# Patient Record
Sex: Male | Born: 1940 | Race: White | Hispanic: No | Marital: Married | Smoking: Former smoker
Health system: Southern US, Community
[De-identification: ages and names within clinical notes are randomized; demographics above are authoritative.]

## PROBLEM LIST (undated history)

## (undated) DIAGNOSIS — I2510 Atherosclerotic heart disease of native coronary artery without angina pectoris: Secondary | ICD-10-CM

## (undated) DIAGNOSIS — Z87448 Personal history of other diseases of urinary system: Secondary | ICD-10-CM

## (undated) DIAGNOSIS — M810 Age-related osteoporosis without current pathological fracture: Secondary | ICD-10-CM

## (undated) DIAGNOSIS — I878 Other specified disorders of veins: Secondary | ICD-10-CM

## (undated) DIAGNOSIS — G4733 Obstructive sleep apnea (adult) (pediatric): Secondary | ICD-10-CM

## (undated) DIAGNOSIS — Z973 Presence of spectacles and contact lenses: Secondary | ICD-10-CM

## (undated) DIAGNOSIS — C4491 Basal cell carcinoma of skin, unspecified: Secondary | ICD-10-CM

## (undated) DIAGNOSIS — T8859XA Other complications of anesthesia, initial encounter: Secondary | ICD-10-CM

## (undated) DIAGNOSIS — N189 Chronic kidney disease, unspecified: Secondary | ICD-10-CM

## (undated) DIAGNOSIS — E785 Hyperlipidemia, unspecified: Secondary | ICD-10-CM

## (undated) DIAGNOSIS — K5790 Diverticulosis of intestine, part unspecified, without perforation or abscess without bleeding: Secondary | ICD-10-CM

## (undated) DIAGNOSIS — Z8669 Personal history of other diseases of the nervous system and sense organs: Secondary | ICD-10-CM

## (undated) DIAGNOSIS — J302 Other seasonal allergic rhinitis: Secondary | ICD-10-CM

## (undated) DIAGNOSIS — L719 Rosacea, unspecified: Secondary | ICD-10-CM

## (undated) DIAGNOSIS — H9190 Unspecified hearing loss, unspecified ear: Secondary | ICD-10-CM

## (undated) DIAGNOSIS — M51369 Other intervertebral disc degeneration, lumbar region without mention of lumbar back pain or lower extremity pain: Secondary | ICD-10-CM

## (undated) DIAGNOSIS — T4145XA Adverse effect of unspecified anesthetic, initial encounter: Secondary | ICD-10-CM

## (undated) DIAGNOSIS — C4492 Squamous cell carcinoma of skin, unspecified: Secondary | ICD-10-CM

## (undated) DIAGNOSIS — Z974 Presence of external hearing-aid: Secondary | ICD-10-CM

## (undated) DIAGNOSIS — M1990 Unspecified osteoarthritis, unspecified site: Secondary | ICD-10-CM

## (undated) DIAGNOSIS — I509 Heart failure, unspecified: Secondary | ICD-10-CM

## (undated) DIAGNOSIS — M5136 Other intervertebral disc degeneration, lumbar region: Secondary | ICD-10-CM

## (undated) DIAGNOSIS — I340 Nonrheumatic mitral (valve) insufficiency: Secondary | ICD-10-CM

## (undated) DIAGNOSIS — Z950 Presence of cardiac pacemaker: Secondary | ICD-10-CM

## (undated) DIAGNOSIS — I499 Cardiac arrhythmia, unspecified: Secondary | ICD-10-CM

## (undated) DIAGNOSIS — I219 Acute myocardial infarction, unspecified: Secondary | ICD-10-CM

## (undated) DIAGNOSIS — I4891 Unspecified atrial fibrillation: Secondary | ICD-10-CM

## (undated) DIAGNOSIS — I739 Peripheral vascular disease, unspecified: Secondary | ICD-10-CM

## (undated) DIAGNOSIS — G629 Polyneuropathy, unspecified: Secondary | ICD-10-CM

## (undated) DIAGNOSIS — I77811 Abdominal aortic ectasia: Secondary | ICD-10-CM

## (undated) DIAGNOSIS — Z87442 Personal history of urinary calculi: Secondary | ICD-10-CM

## (undated) DIAGNOSIS — I10 Essential (primary) hypertension: Secondary | ICD-10-CM

## (undated) DIAGNOSIS — E559 Vitamin D deficiency, unspecified: Secondary | ICD-10-CM

## (undated) HISTORY — PX: BILATERAL CARPAL TUNNEL RELEASE: SHX6508

## (undated) HISTORY — PX: EYE SURGERY: SHX253

## (undated) HISTORY — PX: COLON SURGERY: SHX602

## (undated) HISTORY — PX: JOINT REPLACEMENT: SHX530

## (undated) HISTORY — PX: KNEE ARTHROSCOPY: SUR90

## (undated) HISTORY — PX: TONSILLECTOMY: SUR1361

---
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
## 1996-04-22 DIAGNOSIS — I219 Acute myocardial infarction, unspecified: Secondary | ICD-10-CM

## 1996-04-22 HISTORY — PX: CORONARY ANGIOPLASTY: SHX604

## 1996-04-22 HISTORY — DX: Acute myocardial infarction, unspecified: I21.9

## 1997-11-23 ENCOUNTER — Ambulatory Visit (HOSPITAL_BASED_OUTPATIENT_CLINIC_OR_DEPARTMENT_OTHER): Admission: RE | Admit: 1997-11-23 | Discharge: 1997-11-23 | Payer: Self-pay | Admitting: Orthopedic Surgery

## 1998-01-18 ENCOUNTER — Encounter: Payer: Self-pay | Admitting: Orthopedic Surgery

## 1998-01-18 ENCOUNTER — Inpatient Hospital Stay (HOSPITAL_COMMUNITY): Admission: RE | Admit: 1998-01-18 | Discharge: 1998-01-23 | Payer: Self-pay | Admitting: Orthopedic Surgery

## 1998-12-08 ENCOUNTER — Ambulatory Visit (HOSPITAL_BASED_OUTPATIENT_CLINIC_OR_DEPARTMENT_OTHER): Admission: RE | Admit: 1998-12-08 | Discharge: 1998-12-08 | Payer: Self-pay | Admitting: Orthopedic Surgery

## 2001-04-22 HISTORY — PX: CHOLECYSTECTOMY: SHX55

## 2002-04-22 HISTORY — PX: SHOULDER ARTHROSCOPY: SHX128

## 2003-03-21 ENCOUNTER — Ambulatory Visit (HOSPITAL_BASED_OUTPATIENT_CLINIC_OR_DEPARTMENT_OTHER): Admission: RE | Admit: 2003-03-21 | Discharge: 2003-03-21 | Payer: Self-pay | Admitting: Orthopedic Surgery

## 2003-03-21 ENCOUNTER — Ambulatory Visit (HOSPITAL_COMMUNITY): Admission: RE | Admit: 2003-03-21 | Discharge: 2003-03-21 | Payer: Self-pay | Admitting: Orthopedic Surgery

## 2003-03-21 ENCOUNTER — Encounter: Admission: RE | Admit: 2003-03-21 | Discharge: 2003-03-21 | Payer: Self-pay | Admitting: Orthopedic Surgery

## 2005-04-22 HISTORY — PX: THUMB ARTHROSCOPY: SHX2509

## 2005-08-12 ENCOUNTER — Ambulatory Visit (HOSPITAL_BASED_OUTPATIENT_CLINIC_OR_DEPARTMENT_OTHER): Admission: RE | Admit: 2005-08-12 | Discharge: 2005-08-12 | Payer: Self-pay | Admitting: Orthopedic Surgery

## 2007-04-23 HISTORY — PX: GREAT TOE ARTHRODESIS, INTERPHALANGEAL JOINT: SUR55

## 2007-08-26 ENCOUNTER — Ambulatory Visit (HOSPITAL_BASED_OUTPATIENT_CLINIC_OR_DEPARTMENT_OTHER): Admission: RE | Admit: 2007-08-26 | Discharge: 2007-08-26 | Payer: Self-pay | Admitting: Orthopedic Surgery

## 2009-04-22 HISTORY — PX: ANKLE SURGERY: SHX546

## 2009-10-10 ENCOUNTER — Ambulatory Visit (HOSPITAL_BASED_OUTPATIENT_CLINIC_OR_DEPARTMENT_OTHER): Admission: RE | Admit: 2009-10-10 | Discharge: 2009-10-10 | Payer: Self-pay | Admitting: Orthopedic Surgery

## 2010-04-22 HISTORY — PX: SHOULDER ARTHROSCOPY: SHX128

## 2010-04-22 HISTORY — PX: TOTAL KNEE ARTHROPLASTY: SHX125

## 2010-07-08 LAB — POCT I-STAT, CHEM 8
BUN: 20 mg/dL (ref 6–23)
Calcium, Ion: 1.14 mmol/L (ref 1.12–1.32)
Chloride: 113 mEq/L — ABNORMAL HIGH (ref 96–112)
Creatinine, Ser: 1.1 mg/dL (ref 0.4–1.5)
Glucose, Bld: 89 mg/dL (ref 70–99)
HCT: 40 % (ref 39.0–52.0)
Hemoglobin: 13.6 g/dL (ref 13.0–17.0)
Potassium: 4.6 mEq/L (ref 3.5–5.1)
Sodium: 143 mEq/L (ref 135–145)
TCO2: 20 mmol/L (ref 0–100)

## 2010-09-04 NOTE — Op Note (Signed)
[name], Adam Shannon                ACCOUNT NO.:  [id number]   MEDICAL RECORD NO.:  [id number]          PATIENT TYPE:  AMB   LOCATION:  DSC                          FACILITY:  [facility]   PHYSICIAN:  Feliberto Gottron. Turner Daniels, M.D.   DATE OF BIRTH:  31-Oct-1940   DATE OF PROCEDURE:  08/26/2007  DATE OF DISCHARGE:                               OPERATIVE REPORT   PREOPERATIVE DIAGNOSIS:  Right great toe, large dorsal osteophyte  painful.   POSTOPERATIVE DIAGNOSIS:  Right great toe, large dorsal osteophyte  painful.   PROCEDURE:  Right great toe distal metatarsal cheilectomy.   SURGEON:  Feliberto Gottron. Turner Daniels, M.D.   ASSISTANT:  Shirl Harris PA-C.   ANESTHESIA:  General LMA.   ESTIMATED BLOOD LOSS:  Minimal.   TOURNIQUET TIME:  10 minutes.   FLUID REPLACEMENT:  500 mL crystalloid.   DRAINS PLACED:  None.   INDICATIONS FOR PROCEDURE:  A 70 year old man with a very large right  first metatarsal dorsal osteophyte distally, this caused pain with even  comfortable shoe wear, and he desires elective removal of the same.  He  does not have pain with motion of the joint.  It is mainly from pressure  effect on the dorsal osteophyte from any shoe that he wears short of the  sandal.  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  Risks and benefits of the surgery were  discussed, questions were answered.   DESCRIPTION OF PROCEDURE:  The patient identified by armband and taken  to the operating room at the [facility] where the  appropriate anesthetic monitors were attached and general LMA anesthesia  induced.  Right ankle tourniquet was applied, and the right foot prepped  and draped in usual sterile fashion from the toes to the tourniquet.  The limb was wrapped with an Esmarch bandage, tourniquet inflated 250  mmHg, and a dorsomedial incision was made over the MTP joint  of the  right great toe cutting down on top of the osteophyte which was quite  large and easily visible as the soft tissues were dissected off it.  This was then removed with a small quarter inch osteotome and rongeurs  creating a flat dorsal distal first metatarsal.  The size of the  osteophyte itself was 1 cm x 8 mm x 5 mm, it was quite large, good  position, and is removal eliminated any mass effect over the dorsal  aspect of the right great toe.  At this point, the tourniquet was let  down.  Small bleeders identified and cauterized.  The capsule over the  dorsal aspect of the metatarsophalangeal joint and the extensor tendon  was then closed with running 2-0 Vicryl suture.  The skin with running 4-  0 Monocryl subcuticular suture dressing with Xeroform, 4 x 4 dressings,  sponges with tweeners  between the toes, Webril and Ace wrap was then  applied.  The patient was awakened and taken to the recovery  room  without difficulty.   The patient did receive a gram of Ancef preoperatively and a standard  time-out procedure was performed after the prep and drape.      Feliberto Gottron. Turner Daniels, M.D.  Electronically Signed     FJR/MEDQ  D:  08/26/2007  T:  08/27/2007  Job:  [id number]

## 2010-09-07 NOTE — Op Note (Signed)
Adam Shannon, Adam Shannon                ACCOUNT NO.:  [id number]   MEDICAL RECORD NO.:  [id number]          PATIENT TYPE:  AMB   LOCATION:  DSC                          FACILITY:  [facility]   PHYSICIAN:  Feliberto Gottron. Turner Daniels, M.D.   DATE OF BIRTH:  Sep 09, 1940   DATE OF PROCEDURE:  08/12/2005  DATE OF DISCHARGE:                                 OPERATIVE REPORT   PREOPERATIVE DIAGNOSIS:  End-stage arthritis, right thumb finger metacarpal  trapezial joint.   POSTOPERATIVE DIAGNOSIS:  End-stage arthritis, right thumb finger metacarpal  trapezial joint.   PROCEDURE:  Right thumb finger resection arthroplasty, harvesting one half  of the FCR tendon to form a sling to hold up the first metacarpal in a void  left by the resection by the trapezium resection.   SURGEON:  Feliberto Gottron. Turner Daniels, M.D.   ASSISTANT:  Skip Mayer, P.A.-C.   ANESTHETIC:  General LMA.   ESTIMATED BLOOD LOSS:  50 cc   FLUID REPLACEMENT:  800 cc crystalloid.   DRAINS PLACED:  None.   TOURNIQUET TIME:  40 minutes.   INDICATIONS FOR PROCEDURE:  A 70 year old gentleman with MCP arthritis of  the right thumb finger who has had good temporary response to cortisone  injections but now desires surgical intervention to decrease pain and  increase function of his right thumb. Plain radiographs show end-stage  arthritic changes, and he is prepared for surgical intervention. The risks  and benefits of surgery discussed.   DESCRIPTION OF PROCEDURE:  The patient identified by armband, taken the  operating room at [facility]. Appropriate anesthetic  monitors were attached, and general LMA anesthesia induced with the patient  in supine position. Tourniquet was applied high to the right forearm, and  the right hand prepped and draped in a sterile fashion from the fingertips  to the tourniquet. We began the procedure by making a curvilinear incision  starting out over the radial mid border of the right thumb metacarpal,  going  down to the insertion of the abductor pollicis longus and going along the  wrist flexion crease to the level of the FCR and going proximally for about  another centimeter to centimeter and half. Small bleeders in the skin and  subcutaneous tissue were identified and cauterized. Branches of the  superficial radial nerve were protected, and we then identified the FCR  tendon as the one underneath the ridge of the trapezium. The FCR sheath was  opened. We stripped capsule medially and laterally from the volar aspect of  the trapezium and worked our way around it using rongeurs, the 15 blade and  using a small Homan and baby ________ retractors, worked our way around the  trapezium and excised it from the wrist joint itself. We then selected the  FCR tendon and going down a distance of about 5 or 6 cm split the tendon and  on the radial side cut the tendon to get our FCR stripped to perform our  sling interposition. We then took a small spherical bur through the ulnar  base of the first metacarpal and then  angled it distally and laterally,  creating a tunnel through the base of the first metacarpal that came out  about a centimeter up the lateral side of the metacarpal. Using the tendon  passer, we then passed the FCR tendon through this, applied tension and  impacted some slivers of bone into the tunnel from the trapezium, fixing the  tendon graft into the trapezium and elevating the first metacarpal on a  sling. The graft was then folded back on the lateral cortex of the  metacarpal, going towards the joint, and sutured down with 2-0 FiberWire  suture. We then irrigated out the wound thoroughly and let the tourniquet  down. Small bleeders were identified and cauterized. The capsule was closed  with running 2-0 Vicryl suture. The subcutaneous tissue closed with running  2-0 Vicryl suture and the skin with running interlocking 3-0 nylon suture. A  dressing of Xeroform, 4x4 dressing  sponges, Webril and Ace wrap and a  Velfoam thumb spica splint were then applied. The patient was awakened and  taken to the recovery room without difficulty.      Feliberto Gottron. Turner Daniels, M.D.  Electronically Signed     FJR/MEDQ  D:  08/12/2005  T:  08/13/2005  Job:  [id number]

## 2010-09-07 NOTE — Op Note (Signed)
Adam Shannon, Adam Shannon                          ACCOUNT NO.:  [id number]   MEDICAL RECORD NO.:  [id number]                   PATIENT TYPE:  AMB   LOCATION:  DSC                                  FACILITY:  [facility]   PHYSICIAN:  Feliberto Gottron. Turner Daniels, M.D.                DATE OF BIRTH:  [birth year]   DATE OF PROCEDURE:  03/21/2003  DATE OF DISCHARGE:                                 OPERATIVE REPORT   PREOPERATIVE DIAGNOSIS:  Right shoulder impingement syndrome, possible  rotator cuff tear.   POSTOPERATIVE DIAGNOSES:  Right shoulder impingement syndrome, with  minimally displaced full-thickness rotator cuff tear of the supraspinatus  tendon.   PROCEDURE:  1. Right shoulder anterior-inferior acromioplasty.  2. Removal of 1 cm type 2 subacromial spur.  3. Removal of distal clavicle spur.  4. Mini open rotator cuff repair, using two of the 5 mm super Revo screws.   SURGEON:  Feliberto Gottron. Turner Daniels, M.D.   FIRST ASSISTANT:  Erskine Squibb B. Jannet Mantis.   ANESTHESIA:  Interscalene block, with general endotracheal.   ESTIMATED BLOOD LOSS:  Minimal.   FLUID REPLACEMENT:  One liter Crystalloid.   DRAINS PLACED:  None.   TOURNIQUET TIME:  None.   INDICATIONS FOR PROCEDURE:  A 70 year old gentleman who resides up at [facility]  He does quite a bit of outdoor activities, including skiing;  with bilateral shoulder impingement syndrome.  The left shoulder responded  well to a subacromial cortisone injection.  The right shoulder has  persistent pain after the injection, with a type 2 to type 3 subacromial  spur at 1.2 cm in size.   Cuff power was grossly intact to manual testing.  However, because of  persistent pain, he desires elective decompression of the right shoulder.  The rotator cuff will be addressed, based on the findings, usually it is  simply a torn, shredded cuff.  If it is torn and not amenable to repair in a  65+ year-old man.  If it is a clean tear, a possible rotator cuff repair  will be entertained.   DESCRIPTION OF PROCEDURE:  The patient identified by armband, taken to the  operating room at [facility].  Appropriate anesthetic monitors  were attached. (Note right saline block anesthesia induced.  He was then  placed in the beachchair position, followed the induction of general  endotracheal anesthesia.   The right shoulder was then prepped and draped in the usual sterile fashion  -- with no risk to the hemithorax.  The skin along the anterolateral and  posterior aspects of the acromion process infiltrated with 2-3 cc with .50%  Marcaine and epinephrine solution.  A #11 blade was used to create anterolateral and posterolateral portals,  with the inflow going anteriorly, the arthroscope laterally and 4.2 great  white sucker shaver posteriorly, allowing the remove of the subacromial  bursae.  We immediately identified a  full-thickness, minimally retracted  tear of supraspinatus tendon.  It involved almost 2 cm of the insertion.   We identified the subacromial and subclavicular spurs.  After removing the  soft tissue from around the spurs, removed another  4.5 cm with a  Vortex bur.  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  This is also debrided.   The arthroscope was then removed, and a standard 1-1/2 inch anterolateral  incision was made in the skin along the anterolateral raffae -- starting at  the tip of the acromion and going distally.  The deltoid muscle was split in  line with a skin incision , exposing the superior tubercle of the greater  tuberosity.  The rotator cuff tear was clearly evident now.  It was grasped  with a small Kocher clamp, and was easily mobilized to the defect at the  bony insertion.  We then brought up two 5 mm super Revo  screws, east-loaded  with two 2-0 Ethibond sutures; these were placed into the superior tubercle  of the greater tuberosity to full depth.  We then used the four sutures to  repair the supraspinatus back to its insertion on the greater tuberosity,  obtaining a firm, watertight repair.   The shoulder was taken through a range of motion, confirming the stability  of the repair.  The sutures were then cut, leaving about 3/16 inch of suture  above the knot on each loop.  Each loop had six knots.  The would was then  washed out with normal saline solution.  The subcutaneous tissue was closed  with 2-0 Vicryl suture, and the skin with running, interlocking 3-0 nylon  suture.  A dressing of Xeroform 4 x 4 dressing sponges, Hypa-Fix tape and a  shoulder immobilizer are applied.   The patient was awakened and taken to the recovery room without difficulty.                                               Feliberto Gottron. Turner Daniels, M.D.    Ovid Curd  D:  03/21/2003  T:  03/21/2003  Job:  [id number]

## 2010-12-31 ENCOUNTER — Ambulatory Visit (HOSPITAL_COMMUNITY)
Admission: RE | Admit: 2010-12-31 | Discharge: 2010-12-31 | Disposition: A | Discharge status: Home / Self Care | Payer: Medicare Other | Source: Ambulatory Visit | Attending: Orthopedic Surgery | Admitting: Orthopedic Surgery

## 2010-12-31 ENCOUNTER — Encounter (HOSPITAL_COMMUNITY)
Admission: RE | Admit: 2010-12-31 | Discharge: 2010-12-31 | Disposition: A | Discharge status: Home / Self Care | Payer: Medicare Other | Source: Ambulatory Visit | Attending: Orthopedic Surgery | Admitting: Orthopedic Surgery

## 2010-12-31 ENCOUNTER — Other Ambulatory Visit (HOSPITAL_COMMUNITY): Payer: Self-pay | Admitting: Orthopedic Surgery

## 2010-12-31 DIAGNOSIS — Z01810 Encounter for preprocedural cardiovascular examination: Secondary | ICD-10-CM | POA: Insufficient documentation

## 2010-12-31 DIAGNOSIS — M1711 Unilateral primary osteoarthritis, right knee: Secondary | ICD-10-CM

## 2010-12-31 DIAGNOSIS — M1710 Unilateral primary osteoarthritis, unspecified knee: Secondary | ICD-10-CM | POA: Insufficient documentation

## 2010-12-31 DIAGNOSIS — I10 Essential (primary) hypertension: Secondary | ICD-10-CM | POA: Insufficient documentation

## 2010-12-31 DIAGNOSIS — Z01812 Encounter for preprocedural laboratory examination: Secondary | ICD-10-CM | POA: Insufficient documentation

## 2010-12-31 DIAGNOSIS — IMO0002 Reserved for concepts with insufficient information to code with codable children: Secondary | ICD-10-CM | POA: Insufficient documentation

## 2010-12-31 DIAGNOSIS — Z01811 Encounter for preprocedural respiratory examination: Secondary | ICD-10-CM | POA: Insufficient documentation

## 2010-12-31 LAB — DIFFERENTIAL
Basophils Absolute: 0.1 10*3/uL (ref 0.0–0.1)
Basophils Relative: 1 % (ref 0–1)
Eosinophils Absolute: 0.2 10*3/uL (ref 0.0–0.7)
Eosinophils Relative: 3 % (ref 0–5)
Lymphocytes Relative: 22 % (ref 12–46)
Lymphs Abs: 1.4 10*3/uL (ref 0.7–4.0)
Monocytes Absolute: 0.6 10*3/uL (ref 0.1–1.0)
Monocytes Relative: 10 % (ref 3–12)
Neutro Abs: 4 10*3/uL (ref 1.7–7.7)
Neutrophils Relative %: 64 % (ref 43–77)

## 2010-12-31 LAB — URINALYSIS, ROUTINE W REFLEX MICROSCOPIC
Hgb urine dipstick: NEGATIVE
Nitrite: NEGATIVE
Specific Gravity, Urine: 1.012 (ref 1.005–1.030)
Urobilinogen, UA: 0.2 mg/dL (ref 0.0–1.0)
pH: 5.5 (ref 5.0–8.0)

## 2010-12-31 LAB — CBC
HCT: 40.4 % (ref 39.0–52.0)
MCHC: 34.9 g/dL (ref 30.0–36.0)
Platelets: 197 10*3/uL (ref 150–400)
RDW: 12.4 % (ref 11.5–15.5)
WBC: 6.3 10*3/uL (ref 4.0–10.5)

## 2010-12-31 LAB — APTT: aPTT: 27 seconds (ref 24–37)

## 2010-12-31 LAB — BASIC METABOLIC PANEL
BUN: 18 mg/dL (ref 6–23)
Calcium: 10.3 mg/dL (ref 8.4–10.5)
Chloride: 103 mEq/L (ref 96–112)
Creatinine, Ser: 0.87 mg/dL (ref 0.50–1.35)
GFR calc Af Amer: >60 mL/min (ref >60)

## 2010-12-31 LAB — ABO/RH: ABO/RH(D): O NEG

## 2010-12-31 LAB — TYPE AND SCREEN: ABO/RH(D): O NEG

## 2010-12-31 LAB — PROTIME-INR: INR: 1 (ref 0.00–1.49)

## 2010-12-31 LAB — SURGICAL PCR SCREEN
MRSA, PCR: NEGATIVE
Staphylococcus aureus: NEGATIVE

## 2011-01-02 ENCOUNTER — Inpatient Hospital Stay (HOSPITAL_COMMUNITY)
Admission: RE | Admit: 2011-01-02 | Discharge: 2011-01-04 | DRG: 470 | Disposition: A | Discharge status: Home Health | Payer: Medicare Other | Source: Ambulatory Visit | Attending: Orthopedic Surgery | Admitting: Orthopedic Surgery

## 2011-01-02 DIAGNOSIS — I252 Old myocardial infarction: Secondary | ICD-10-CM

## 2011-01-02 DIAGNOSIS — Z7901 Long term (current) use of anticoagulants: Secondary | ICD-10-CM

## 2011-01-02 DIAGNOSIS — Z01812 Encounter for preprocedural laboratory examination: Secondary | ICD-10-CM

## 2011-01-02 DIAGNOSIS — M1710 Unilateral primary osteoarthritis, unspecified knee: Principal | ICD-10-CM | POA: Diagnosis present

## 2011-01-02 DIAGNOSIS — I10 Essential (primary) hypertension: Secondary | ICD-10-CM | POA: Diagnosis present

## 2011-01-02 DIAGNOSIS — I2510 Atherosclerotic heart disease of native coronary artery without angina pectoris: Secondary | ICD-10-CM | POA: Diagnosis present

## 2011-01-02 DIAGNOSIS — Z9861 Coronary angioplasty status: Secondary | ICD-10-CM

## 2011-01-02 DIAGNOSIS — Z7982 Long term (current) use of aspirin: Secondary | ICD-10-CM

## 2011-01-02 DIAGNOSIS — E7800 Pure hypercholesterolemia, unspecified: Secondary | ICD-10-CM | POA: Diagnosis present

## 2011-01-02 DIAGNOSIS — Z01810 Encounter for preprocedural cardiovascular examination: Secondary | ICD-10-CM

## 2011-01-02 DIAGNOSIS — Z79899 Other long term (current) drug therapy: Secondary | ICD-10-CM

## 2011-01-02 DIAGNOSIS — Z87891 Personal history of nicotine dependence: Secondary | ICD-10-CM

## 2011-01-02 DIAGNOSIS — Z96659 Presence of unspecified artificial knee joint: Secondary | ICD-10-CM

## 2011-01-03 LAB — CBC
Platelets: 154 10*3/uL (ref 150–400)
RBC: 3.25 MIL/uL — ABNORMAL LOW (ref 4.22–5.81)
RDW: 12.5 % (ref 11.5–15.5)
WBC: 6.2 10*3/uL (ref 4.0–10.5)

## 2011-01-03 LAB — BASIC METABOLIC PANEL
Calcium: 8.7 mg/dL (ref 8.4–10.5)
GFR calc Af Amer: >60 mL/min (ref >60)
GFR calc non Af Amer: >60 mL/min (ref >60)
Potassium: 4.1 mEq/L (ref 3.5–5.1)
Sodium: 136 mEq/L (ref 135–145)

## 2011-01-03 LAB — PROTIME-INR: INR: 1.16 (ref 0.00–1.49)

## 2011-01-04 LAB — PROTIME-INR
INR: 1.45 (ref 0.00–1.49)
Prothrombin Time: 17.9 seconds — ABNORMAL HIGH (ref 11.6–15.2)

## 2011-01-04 LAB — CBC
MCH: 33.5 pg (ref 26.0–34.0)
Platelets: 134 10*3/uL — ABNORMAL LOW (ref 150–400)
RBC: 2.78 MIL/uL — ABNORMAL LOW (ref 4.22–5.81)
WBC: 6.4 10*3/uL (ref 4.0–10.5)

## 2011-01-05 NOTE — Op Note (Signed)
Adam Shannon, Adam Shannon NO.:  [id number]  MEDICAL RECORD NO.:  [id number]  LOCATION:  5021                         FACILITY:  [facility]  PHYSICIAN:  Feliberto Gottron. Turner Daniels, M.D.   DATE OF BIRTH:  [birth year]  DATE OF PROCEDURE:  01/02/2011 DATE OF DISCHARGE:                              OPERATIVE REPORT   PREOPERATIVE DIAGNOSIS:  End-stage arthritis right knee bone-on-bone.  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  PROCEDURE:  Right total knee arthroplasty using DePuy Sigma RP components 4 femur, 5 tibia, 10-mm Sigma RP bearing, 41-mm patellar button.  All components cemented with double batch of DePuy HV cement with 1500 mg of Zinacef.  SURGEON:  Feliberto Gottron. Turner Daniels, MD  FIRST ASSISTANT:  Shirl Harris, PA-C  ANESTHETIC:  General endotracheal.  ESTIMATED BLOOD LOSS:  Minimal.  FLUID REPLACEMENT:  1500 mL of crystalloid.  DRAINS PLACED:  Foley catheter, two medium Hemovacs.  URINE OUTPUT:  300 mL.  TOURNIQUET TIME:  1 hour and 10 minutes.  INDICATIONS FOR PROCEDURE:  A 70 year old man who underwent a left total knee arthroplasty by me about 8 years ago and has done well, has end- stage arthritis of the right knee, has failed conservative measures including anti-inflammatory medicine, multiple cortisone injections, Viscoat supplementation, arthroscopic washout, and activity modifications.  He is down to bone-on-bone.  He has severe unremitting pain both at use and at rest and desires elective right total knee arthroplasty.  Risks and benefits of surgery are known to the patient and were reinforced.  DESCRIPTION OF PROCEDURE:  The patient identified by armband, taken to the holding area at [facility].  Appropriate anesthetic monitors were attached and he received 1 g of Ancef IV and a right femoral nerve block.  Taken to the operating room 15, appropriate anesthetic monitors were reattached.  General endotracheal anesthesia  induced.  The patient in supine position.  Foley catheter inserted.  Lateral post foot positioner applied to the table.  Tourniquet applied high to the right thigh.  Right lower extremity prepped and draped in sterile fashion from the ankle to the tourniquet.  Time-out procedure performed.  Limb wrapped with an Esmarch bandage with the knee bent, tourniquet inflated to 350 mmHg and we began the operation by making anterior midline incision starting 4-5 cm above the patella, going over the patella 1 cm medial to and 4 cm distal to the tibial tubercle.  We cut through the skin and subcutaneous tissue down to the level of the transverse retinaculum which was incised and reflected medially allowing medial parapatellar arthrotomy.  Prepatellar fat pad was resected.  Superficial medial collateral ligament elevated from anterior and posterior leaving intact distally on the flare of the tibia.  This allowed [place] to evert the patella, hyperflex the knee, and externally rotate the tibia.  The cruciate ligaments were then resected as were the anterior one half of the menisci, exposing the tibial plateaus.  A lateral Hohmann retractor was placed, a posteromedial Z retractor and a McCullough retractor through the notch.  We instrumented the proximal tibia in line with the axis with a DePuy step drill, followed by an intramedullary guide rod set for  2 degrees posterior slope.  This allowed [place] to resect 4-5 mm bone medially, 8-9 mm of bone laterally using the power saw for pinning the cutting guide into place.  We then entered the distal femur 2 mm anterior to the PCL origin with a step drill followed by the IM rod set for a 5 degree right distal femoral cut set at 11 mm, pinned along the epicondylar axis.  The distal femoral cut was accomplished using the posterior referencing sizing guide, we sized for a #4 femoral component, placed the pins in 3 degrees of external rotation and applied the chamfer  cutting block.  Anterior-posterior chamfer cuts were accomplished without difficulty, followed by the Sigma RP box cut.  The knee was brought into full extension and held there with a lamina spreader allowing [place] to remove the posterior horns of the meniscus and we also checked our extension gap which was good for a 10-mm bearing as was the flexion gap.  At this point, we measured the patella 26 mm, set the cutting guide at 16, and removed the posterior 10 mm of the patella sized to a 41 button and drilled.  The knee was once again hyperflexed, sized to #5 tibial baseplate, which was pinned into place, followed by the smokestack conical reamer and the Delta fin keel punch.  A 4 right trial femoral component was hammered into place.  The lugs were drilled. We inserted a 10-mm trial bearing, reduced the knee and brought to full extension.  Ligamentous stability was noted to be excellent in 0, 45 and 90 degrees of flexion.  At this point, all trial components were removed including the patella which had tracked nicely and all bony surfaces were water picked clean dried with suction and sponges.  A double batch of DePuy HV cement was mixed at the back table with 1500 mg of Zinacef and applied to all bony metallic mating surfaces except for the posterior condyles of the femur itself.  In order we hammered into place a #5 tibial baseplate and removed excess cement, a 4 right femoral component and removed excess cement.  The 10-mm Sigma RP bearing was inserted, the knee reduced and held in full extension.  The patellar button was then squeezed into place and held with a clamp and excess cement removed and the cement was allowed to cure as we irrigated out with normal saline solution and placed the drains from the anterolateral approach.  After the cement had cured, the knee was taken through a range of motion from 0-130 degrees with excellent ligamentous stability. The parapatellar arthrotomy was  then closed with running #1 Vicryl suture, the subcutaneous tissue with 0 and 2-0 undyed Vicryl suture, and the skin with skin staples.  A dressing of Xeroform, 4x4 dressing sponges, Webril, and an Ace wrap applied.  Tourniquet let down.  The patient awakened, extubated and taken to the recovery room without difficulty.     Feliberto Gottron. Turner Daniels, M.D.     Ovid Curd  D:  01/02/2011  T:  01/02/2011  Job:  [id number]  Electronically Signed by Gean Birchwood M.D. on 01/05/2011 09:48:32 PM

## 2011-04-23 HISTORY — PX: LUMBAR LAMINECTOMY: SHX95

## 2011-11-06 ENCOUNTER — Other Ambulatory Visit: Payer: Self-pay | Admitting: Orthopedic Surgery

## 2011-12-09 ENCOUNTER — Encounter (HOSPITAL_BASED_OUTPATIENT_CLINIC_OR_DEPARTMENT_OTHER): Payer: Self-pay | Admitting: *Deleted

## 2011-12-09 NOTE — Progress Notes (Signed)
Pt will need istat- called for recent cardiology notes

## 2011-12-11 ENCOUNTER — Encounter (HOSPITAL_BASED_OUTPATIENT_CLINIC_OR_DEPARTMENT_OTHER): Payer: Self-pay | Admitting: Certified Registered"

## 2011-12-11 ENCOUNTER — Ambulatory Visit (HOSPITAL_BASED_OUTPATIENT_CLINIC_OR_DEPARTMENT_OTHER): Payer: Medicare Other | Admitting: Certified Registered"

## 2011-12-11 ENCOUNTER — Ambulatory Visit (HOSPITAL_BASED_OUTPATIENT_CLINIC_OR_DEPARTMENT_OTHER)
Admission: RE | Admit: 2011-12-11 | Discharge: 2011-12-11 | Disposition: A | Discharge status: Home / Self Care | Payer: Medicare Other | Source: Ambulatory Visit | Attending: Orthopedic Surgery | Admitting: Orthopedic Surgery

## 2011-12-11 ENCOUNTER — Encounter (HOSPITAL_BASED_OUTPATIENT_CLINIC_OR_DEPARTMENT_OTHER)
Admission: RE | Disposition: A | Discharge status: Home / Self Care | Payer: Self-pay | Source: Ambulatory Visit | Attending: Orthopedic Surgery

## 2011-12-11 ENCOUNTER — Encounter (HOSPITAL_BASED_OUTPATIENT_CLINIC_OR_DEPARTMENT_OTHER): Payer: Self-pay | Admitting: *Deleted

## 2011-12-11 DIAGNOSIS — Z9861 Coronary angioplasty status: Secondary | ICD-10-CM | POA: Insufficient documentation

## 2011-12-11 DIAGNOSIS — M1812 Unilateral primary osteoarthritis of first carpometacarpal joint, left hand: Secondary | ICD-10-CM

## 2011-12-11 DIAGNOSIS — I10 Essential (primary) hypertension: Secondary | ICD-10-CM | POA: Insufficient documentation

## 2011-12-11 DIAGNOSIS — M19049 Primary osteoarthritis, unspecified hand: Secondary | ICD-10-CM | POA: Insufficient documentation

## 2011-12-11 DIAGNOSIS — M129 Arthropathy, unspecified: Secondary | ICD-10-CM | POA: Insufficient documentation

## 2011-12-11 DIAGNOSIS — I2510 Atherosclerotic heart disease of native coronary artery without angina pectoris: Secondary | ICD-10-CM | POA: Insufficient documentation

## 2011-12-11 DIAGNOSIS — I252 Old myocardial infarction: Secondary | ICD-10-CM | POA: Insufficient documentation

## 2011-12-11 HISTORY — DX: Hyperlipidemia, unspecified: E78.5

## 2011-12-11 HISTORY — DX: Atherosclerotic heart disease of native coronary artery without angina pectoris: I25.10

## 2011-12-11 HISTORY — DX: Essential (primary) hypertension: I10

## 2011-12-11 HISTORY — DX: Acute myocardial infarction, unspecified: I21.9

## 2011-12-11 HISTORY — DX: Unspecified osteoarthritis, unspecified site: M19.90

## 2011-12-11 HISTORY — DX: Presence of spectacles and contact lenses: Z97.3

## 2011-12-11 HISTORY — DX: Other seasonal allergic rhinitis: J30.2

## 2011-12-11 HISTORY — PX: LIGAMENT REPAIR: SHX5444

## 2011-12-11 HISTORY — DX: Presence of external hearing-aid: Z97.4

## 2011-12-11 LAB — POCT I-STAT, CHEM 8
Calcium, Ion: 1.25 mmol/L (ref 1.13–1.30)
Glucose, Bld: 89 mg/dL (ref 70–99)
HCT: 46 % (ref 39.0–52.0)
Hemoglobin: 15.6 g/dL (ref 13.0–17.0)

## 2011-12-11 SURGERY — REPAIR, LIGAMENT
Anesthesia: Monitor Anesthesia Care | Site: Thumb | Laterality: Left | Wound class: Clean

## 2011-12-11 MED ORDER — DEXAMETHASONE SODIUM PHOSPHATE 4 MG/ML IJ SOLN
INTRAMUSCULAR | Status: DC | PRN
  Administered 2011-12-11: 4 mg via INTRAVENOUS

## 2011-12-11 MED ORDER — LIDOCAINE HCL (CARDIAC) 20 MG/ML IV SOLN
INTRAVENOUS | Status: DC | PRN
  Administered 2011-12-11: 20 mg via INTRAVENOUS

## 2011-12-11 MED ORDER — DEXTROSE-NACL 5-0.45 % IV SOLN: INTRAVENOUS | Status: DC

## 2011-12-11 MED ORDER — ONDANSETRON HCL 4 MG/2ML IJ SOLN
INTRAMUSCULAR | Status: DC | PRN
  Administered 2011-12-11: 4 mg via INTRAVENOUS

## 2011-12-11 MED ORDER — MIDAZOLAM HCL 5 MG/5ML IJ SOLN
INTRAMUSCULAR | Status: DC | PRN
  Administered 2011-12-11: 1 mg via INTRAVENOUS

## 2011-12-11 MED ORDER — MIDAZOLAM HCL 2 MG/2ML IJ SOLN
1.0000 mg | INTRAMUSCULAR | Status: DC | PRN
  Administered 2011-12-11 (×2): 1 mg via INTRAVENOUS

## 2011-12-11 MED ORDER — FENTANYL CITRATE 0.05 MG/ML IJ SOLN
50.0000 ug | INTRAMUSCULAR | Status: DC | PRN
  Administered 2011-12-11 (×2): 50 ug via INTRAVENOUS

## 2011-12-11 MED ORDER — TRAMADOL HCL 50 MG PO TABS: 50.0000 mg | ORAL_TABLET | ORAL | Status: AC | PRN

## 2011-12-11 MED ORDER — LACTATED RINGERS IV SOLN
INTRAVENOUS | Status: DC
  Administered 2011-12-11: 10:00:00 via INTRAVENOUS

## 2011-12-11 MED ORDER — BUPIVACAINE-EPINEPHRINE PF 0.5-1:200000 % IJ SOLN
INTRAMUSCULAR | Status: DC | PRN
  Administered 2011-12-11: 30 mL

## 2011-12-11 MED ORDER — CHLORHEXIDINE GLUCONATE 4 % EX LIQD: 60.0000 mL | Freq: Once | CUTANEOUS | Status: DC

## 2011-12-11 MED ORDER — CEFAZOLIN SODIUM-DEXTROSE 2-3 GM-% IV SOLR
2.0000 g | INTRAVENOUS | Status: AC
  Administered 2011-12-11: 2 g via INTRAVENOUS

## 2011-12-11 MED ORDER — FENTANYL CITRATE 0.05 MG/ML IJ SOLN
INTRAMUSCULAR | Status: DC | PRN
  Administered 2011-12-11 (×2): 50 ug via INTRAVENOUS

## 2011-12-11 MED ORDER — LIDOCAINE-EPINEPHRINE (PF) 1.5 %-1:200000 IJ SOLN
INTRAMUSCULAR | Status: DC | PRN
  Administered 2011-12-11: 20 mL

## 2011-12-11 MED ORDER — PROPOFOL 10 MG/ML IV EMUL
INTRAVENOUS | Status: DC | PRN
  Administered 2011-12-11: 140 ug/kg/min via INTRAVENOUS

## 2011-12-11 SURGICAL SUPPLY — 52 items
BANDAGE ELASTIC 3 VELCRO ST LF (GAUZE/BANDAGES/DRESSINGS) ×2 IMPLANT
BLADE SURG 15 STRL LF DISP TIS (BLADE) ×2 IMPLANT
BLADE SURG 15 STRL SS (BLADE) ×2
BNDG ELASTIC 2 VLCR STRL LF (GAUZE/BANDAGES/DRESSINGS) IMPLANT
BNDG ESMARK 4X9 LF (GAUZE/BANDAGES/DRESSINGS) ×2 IMPLANT
CHLORAPREP W/TINT 26ML (MISCELLANEOUS) ×2 IMPLANT
CORDS BIPOLAR (ELECTRODE) ×2 IMPLANT
COVER MAYO STAND STRL (DRAPES) ×2 IMPLANT
COVER TABLE BACK 60X90 (DRAPES) ×2 IMPLANT
DECANTER SPIKE VIAL GLASS SM (MISCELLANEOUS) IMPLANT
DRAPE EXTREMITY T 121X128X90 (DRAPE) ×2 IMPLANT
DRAPE U-SHAPE 47X51 STRL (DRAPES) ×2 IMPLANT
ELECT REM PT RETURN 9FT ADLT (ELECTROSURGICAL)
ELECTRODE REM PT RTRN 9FT ADLT (ELECTROSURGICAL) IMPLANT
GAUZE SPONGE 4X4 16PLY XRAY LF (GAUZE/BANDAGES/DRESSINGS) IMPLANT
GAUZE XEROFORM 1X8 LF (GAUZE/BANDAGES/DRESSINGS) ×2 IMPLANT
GLOVE BIO SURGEON STRL SZ 6.5 (GLOVE) ×2 IMPLANT
GLOVE BIO SURGEON STRL SZ7 (GLOVE) ×2 IMPLANT
GLOVE BIO SURGEON STRL SZ7.5 (GLOVE) ×2 IMPLANT
GLOVE BIOGEL PI IND STRL 8 (GLOVE) ×1 IMPLANT
GLOVE BIOGEL PI INDICATOR 8 (GLOVE) ×1
GLOVE INDICATOR 7.0 STRL GRN (GLOVE) ×4 IMPLANT
GOWN BRE IMP PREV XXLGXLNG (GOWN DISPOSABLE) ×2 IMPLANT
GOWN PREVENTION PLUS XLARGE (GOWN DISPOSABLE) ×4 IMPLANT
NEEDLE HYPO 22GX1.5 SAFETY (NEEDLE) IMPLANT
NEEDLE KEITH (NEEDLE) IMPLANT
PACK BASIN DAY SURGERY FS (CUSTOM PROCEDURE TRAY) ×2 IMPLANT
PAD CAST 3X4 CTTN HI CHSV (CAST SUPPLIES) ×1 IMPLANT
PADDING CAST ABS 4INX4YD NS (CAST SUPPLIES) ×1
PADDING CAST ABS COTTON 4X4 ST (CAST SUPPLIES) ×1 IMPLANT
PADDING CAST COTTON 3X4 STRL (CAST SUPPLIES) ×1
PADDING UNDERCAST 2  STERILE (CAST SUPPLIES) IMPLANT
PASSER SUT SWANSON 36MM LOOP (INSTRUMENTS) ×2 IMPLANT
PENCIL BUTTON HOLSTER BLD 10FT (ELECTRODE) IMPLANT
SPLINT PLASTER CAST XFAST 3X15 (CAST SUPPLIES) IMPLANT
SPLINT PLASTER XTRA FASTSET 3X (CAST SUPPLIES)
SUCTION FRAZIER TIP 10 FR DISP (SUCTIONS) ×2 IMPLANT
SUT ETHILON 4 0 PS 2 18 (SUTURE) IMPLANT
SUT ETHILON 5 0 PS 2 18 (SUTURE) IMPLANT
SUT FIBERWIRE 2-0 18 17.9 3/8 (SUTURE) ×2
SUT VIC AB 0 SH 27 (SUTURE) IMPLANT
SUT VIC AB 1 CT1 27 (SUTURE)
SUT VIC AB 1 CT1 27XBRD ANBCTR (SUTURE) IMPLANT
SUT VIC AB 2-0 SH 27 (SUTURE)
SUT VIC AB 2-0 SH 27XBRD (SUTURE) IMPLANT
SUTURE FIBERWR 2-0 18 17.9 3/8 (SUTURE) ×1 IMPLANT
SYR BULB 3OZ (MISCELLANEOUS) ×2 IMPLANT
SYR CONTROL 10ML LL (SYRINGE) IMPLANT
TOWEL OR 17X24 6PK STRL BLUE (TOWEL DISPOSABLE) ×4 IMPLANT
TUBE CONNECTING 20X1/4 (TUBING) ×2 IMPLANT
UNDERPAD 30X30 INCONTINENT (UNDERPADS AND DIAPERS) ×2 IMPLANT
WATER STERILE IRR 1000ML POUR (IV SOLUTION) ×2 IMPLANT

## 2011-12-11 NOTE — Anesthesia Postprocedure Evaluation (Signed)
  Anesthesia Post-op Note  Patient: Adam Shannon  Procedure(s) Performed: Procedure(s) (LRB): LIGAMENT REPAIR (Left)  Patient Location: PACU  Anesthesia Type: MAC combined with regional for post-op pain  Level of Consciousness: awake, alert  and oriented  Airway and Oxygen Therapy: Patient Spontanous Breathing  Post-op Pain: none  Post-op Assessment: Post-op Vital signs reviewed, Patient's Cardiovascular Status Stable, Respiratory Function Stable, Patent Airway, No signs of Nausea or vomiting and Pain level controlled  Post-op Vital Signs: Reviewed and stable  Complications: No apparent anesthesia complications

## 2011-12-11 NOTE — Anesthesia Preprocedure Evaluation (Signed)
Anesthesia Evaluation  Patient identified by MRN, date of birth, ID band Patient awake    Reviewed: Allergy & Precautions, H&P , NPO status , Patient's Chart, lab work & pertinent test results, reviewed documented beta blocker date and time   History of Anesthesia Complications Negative for: history of anesthetic complications  Airway Mallampati: II TM Distance: >3 FB Neck ROM: Full    Dental No notable dental hx. (+) Teeth Intact and Dental Advisory Given   Pulmonary former smoker (quit 1970's),  breath sounds clear to auscultation  Pulmonary exam normal       Cardiovascular hypertension (cards took patient off B blocker in June), Pt. on medications + CAD (2008 cath: stent patent, non-obstructive ASCADz, '10 stress test: no ischemia, normal LVF), + Past MI (STEMI AMI 1998) and + Cardiac Stents (1998 LAD stent ) Rhythm:Regular Rate:Normal  6/13 ECHO:  Normal LVF, EF 55-60%, mild MR   Neuro/Psych negative neurological ROS     GI/Hepatic negative GI ROS, Neg liver ROS,   Endo/Other  negative endocrine ROS  Renal/GU      Musculoskeletal  (+) Arthritis -, Osteoarthritis,    Abdominal   Peds  Hematology negative hematology ROS (+)   Anesthesia Other Findings   Reproductive/Obstetrics                           Anesthesia Physical Anesthesia Plan  ASA: III  Anesthesia Plan: Regional and MAC   Post-op Pain Management:    Induction: Intravenous  Airway Management Planned: Simple Face Mask  Additional Equipment:   Intra-op Plan:   Post-operative Plan:   Informed Consent: I have reviewed the patients History and Physical, chart, labs and discussed the procedure including the risks, benefits and alternatives for the proposed anesthesia with the patient or authorized representative who has indicated his/her understanding and acceptance.   Dental advisory given  Plan Discussed with: CRNA and  Surgeon  Anesthesia Plan Comments: (Plan routine monitors, axillary block with MAC heavy sedation )        Anesthesia Quick Evaluation

## 2011-12-11 NOTE — Transfer of Care (Signed)
Immediate Anesthesia Transfer of Care Note  Patient: Adam Shannon  Procedure(s) Performed: Procedure(s) (LRB): LIGAMENT REPAIR (Left)  Patient Location: PACU  Anesthesia Type: MAC combined with regional for post-op pain  Level of Consciousness: awake, alert , oriented and patient cooperative  Airway & Oxygen Therapy: Patient Spontanous Breathing and Patient connected to face mask oxygen  Post-op Assessment: Report given to PACU RN and Post -op Vital signs reviewed and stable  Post vital signs: Reviewed and stable  Complications: No apparent anesthesia complications

## 2011-12-11 NOTE — Anesthesia Procedure Notes (Addendum)
Anesthesia Regional Block:  Axillary brachial plexus block  Pre-Anesthetic Checklist: ,, timeout performed, Correct Patient, Correct Site, Correct Laterality, Correct Procedure, Correct Position, site marked, Risks and benefits discussed,  Surgical consent,  Pre-op evaluation,  At surgeon's request and post-op pain management  Laterality: Left  Prep: chloraprep       Needles:  Injection technique: Single-shot  Needle Type: Other   (#22ga blunt "B" bevel needle)    Needle Gauge: 22 and 22 G    Additional Needles:  Procedures: paresthesia technique Axillary brachial plexus block  Nerve Stimulator or Paresthesia:  Response: transient ulnar paresthesia,  Response: transient radial nerve paresthesia,   Additional Responses:   Narrative:  Start time: 12/11/2011 10:54 AM End time: 12/11/2011 11:06 AM Injection made incrementally with aspirations every 5 mL.  Performed by: Personally  Anesthesiologist: Sandford Craze, MD  Additional Notes: Pt identified in Holding room.  Monitors applied. Working IV access confirmed. Sterile prep L axilla.  #22ga "B" bevel to transient ulnar nerve and then transient radial nerve paresthesia.  Total 20cc 1.5% lidocaine with 1:200k epi and 30cc 0.5% Bupivacaine with 1:200k epi injected incrementally after negative test dose, distributed around each paresthesia. Additional 5cc 1.5% Lidocaine with 1:200k epi injected subq for intercostobrachial nerve supplementation. Patient asymptomatic, VSS, no heme aspirated, tolerated well.   Sandford Craze, MD  Axillary brachial plexus block Procedure Name: MAC Date/Time: 12/11/2011 12:05 PM Performed by: Verlan Friends Pre-anesthesia Checklist: Patient identified, Timeout performed, Emergency Drugs available, Suction available and Patient being monitored Patient Re-evaluated:Patient Re-evaluated prior to inductionOxygen Delivery Method: Simple face mask Placement Confirmation: positive ETCO2

## 2011-12-11 NOTE — Op Note (Signed)
PATIENT ID:      Adam Shannon  MRN:     [id number] DOB/AGE:    [birth year] / 71 y.o.       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   Weight as of this encounter: 194 lb 6 oz(88.168 kg).                                                        POSTOPERATIVE DIAGNOSIS:   same as preop                                                                      PROCEDURE:  Left thumb and CT arthroplasty with FCR ligament suspension and and show the procedure     SURGEON: Betsi Crespi J    ASSISTANT:   Shirl Harris PA-C   (Present and scrubbed throughout the case, critical for assistance with exposure, retraction, and closure.)         ANESTHESIA: GET    TOURNIQUET TIME:   COMPLICATIONS:  None        SPECIMENS: None   INDICATIONS FOR PROCEDURE: The patient has left thumb joint MCT arthritis and is failed conservative treatment with anti-inflammatory medicines, splinting got good temporarily from a couple of cortisone injections. He has had an MCT arthroplasty on the contralateral side and done well. He desires elective left from MCT arthroplasty. Risks and benefits of surgery have been discussed, questions answered.    A tourniquet was placed on the forearm. The upper extremity was prepped and draped in the usual sterile fashion from the tourniquet to the fingertips. A timeout procedure was performed.The hand and forearm were wrapped with an Esmarch bandage and the tourniquet inflated to Hg. a curved incision starting along the volar radial border of the thumb metacarpal at the mid metacarpal going over the proximal tip of the metacarpal and then curving over to the FCR tendon was made through the skin and subcutaneous tissue. The abductor pollicis longus was  retracted radially and the FCR ulnarly exposing the capsule over the trapezius. This was incised longitudinally and the trapezius bone was removed piecemeal with Aundria Rud and a small osteotome were careful to preserve the FCR is a 1 through the trapezial tunnel. We then made a longitudinal split in the FCR near its insertion and passed a 2-0 FiberWire through the split. Moving up the FCR tendon with a Therapist, nutritional subcutaneously we identified an area on the skin 8 cm proximal at the tip of the Wright and made a 1 cm incision. Using the curved tendon passer to the the we then delivered the 2-0 FiberWire through the proximal wound and using it like a saw brought the fiber wire up through the tendon proximally. We then cut through half of the tendon proximally and  delivered that branched through the distal wound creating the tendon sling. On the radial border of the thumb metacarpal 1.5 cm proximal the tip we then drilled through with a 316 sterile bit obliquely towards the articular surface and then using the tendon passer brought the FCR tendon up through the articular portion of the drill hole out radially and sutured it down to the radial border of the thumb metacarpal going distally keeping tension on the tendon. This suspended the thumb metacarpal nicely. The excess 6 cm of tendon was then taken between the abductor pollicis longus and the extensor pollicis brevis back into the joint and rolled up into an anchovy and sutured with 20 FiberWire. This formed a nice cushion between the base of the thumb metacarpal and the carpus. The tourniquet was let down small bleeders identified and cauterized the capsular incision closed with 20 FiberWire the subcutaneous tissue with running 3 Vicryl and a subcuticular 3-0 Vicryl was also placed. The proximal wound for the tendon was closed with 3-0 subcuticular Vicryl as well.. A dressing of Xerofoam, 4 x 4 dressing sponges, 2 inch web roll and 2 inch Ace wrap was applied and  the Esmarch tourniquet removed for a tourniquet time of 10 minutes. At this point, a plaster thumb spica splint was applied, patient was then taken to the recovery without difficulty.

## 2011-12-11 NOTE — Interval H&P Note (Signed)
History and Physical Interval Note:  12/11/2011 11:34 AM  Adam Shannon  has presented today for surgery, with the diagnosis of left thumb MCT joint djd  The various methods of treatment have been discussed with the patient and family. After consideration of risks, benefits and other options for treatment, the patient has consented to  Procedure(s) (LRB): LIGAMENT REPAIR (Left) as a surgical intervention .  The patient's history has been reviewed, patient examined, no change in status, stable for surgery.  I have reviewed the patient's chart and labs.  Questions were answered to the patient's satisfaction.     Nestor Lewandowsky

## 2011-12-11 NOTE — Progress Notes (Signed)
Assisted Dr. Jackson with left, axillary block. Side rails up, monitors on throughout procedure. See vital signs in flow sheet. Tolerated Procedure well. 

## 2011-12-11 NOTE — H&P (Signed)
Adam Shannon is an 71 y.o. male.   Chief Complaint: left thumb pain HPI: [name] complains of increased pain in the MCT joint of his left thumb.  He had an injection a few years ago that provided him with significant pain relief.  He had an anchovy procedure on the right thumb and reports that it is doing well.  Past Medical History  Diagnosis Date  . Coronary artery disease   . Myocardial infarction   . Hyperlipemia   . Hypertension   . Arthritis   . Seasonal allergies   . Wears hearing aid     both ears  . Wears glasses     reading only    Past Surgical History  Procedure Date  . Coronary angioplasty 1998    stent lad  . Shoulder arthroscopy 2012    left  . Joint replacement     lt total knee  . Total knee arthroplasty 2012    rt   . Thumb arthroscopy 2007  . Great toe arthrodesis, interphalangeal joint 2009    rt  . Ankle surgery 2011    lt  . Shoulder arthroscopy 2004    rt  . Tonsillectomy   . Lumbar laminectomy 2013    repeat for leaking spinal fluid    No family history on file. Social History:  reports that he has never smoked. He does not have any smokeless tobacco history on file. He reports that he drinks alcohol. He reports that he does not use illicit drugs.  Allergies:  Allergies  Allergen Reactions  . Darvocet (Propoxyphene-Acetaminophen)     Cannot sleep  . Hydrocodone     keeps him awake  . Oxycodone     Muscle spasms    No prescriptions prior to admission    No results found for this or any previous visit (from the past 48 hour(s)). No results found.  Review of Systems  Constitutional: Negative.   HENT: Negative.   Eyes: Negative.   Respiratory: Negative.   Cardiovascular: Negative.   Genitourinary: Negative.   Musculoskeletal: Positive for joint pain.  Skin: Negative.   Neurological: Negative.   Endo/Heme/Allergies: Negative.   Psychiatric/Behavioral: Negative.     Height 5\' 9"  (1.753 m), weight 87.544 kg (193  lb). Physical Exam  Constitutional: He is oriented to person, place, and time. He appears well-developed and well-nourished.  HENT:  Head: Normocephalic.  Eyes: Pupils are equal, round, and reactive to light.  Neck: Neck supple.  Cardiovascular: Normal rate and regular rhythm.   Respiratory: Breath sounds normal.  GI: Soft.  Musculoskeletal: He exhibits tenderness.  Neurological: He is alert and oriented to person, place, and time.  Psychiatric: He has a normal mood and affect.    Regarding the left thumb, he has a positive grind test to the MCT joint, is neurovascularly intact and does have slightly diminished grip strength secondary to pain.  Again options were discussed with the patient and after obtaining informed verbal consent the left thumb MCT joint was sterilely prepped and injected with 1 cc of Xylocaine, 1 cc of Marcaine, and 1 cc of betamethasone without difficulty.  5 minutes after the injection both joints were painless.  Assessment/Plan A: Left thumb MCT joint arthritis (s/p successful anchovy procedure on right thumb)  P: We will go ahead and set up an anchovy procedure for the left thumb when the cortisone injection wears off.  He is well aware of the risks and benefits of surgery which were  discussed with him today.  Deijah Spikes M. 12/11/2011, 8:39 AM

## 2011-12-17 ENCOUNTER — Encounter (HOSPITAL_BASED_OUTPATIENT_CLINIC_OR_DEPARTMENT_OTHER): Payer: Self-pay

## 2011-12-17 ENCOUNTER — Encounter (HOSPITAL_BASED_OUTPATIENT_CLINIC_OR_DEPARTMENT_OTHER): Payer: Self-pay | Admitting: Orthopedic Surgery

## 2012-12-21 DIAGNOSIS — N189 Chronic kidney disease, unspecified: Secondary | ICD-10-CM

## 2012-12-21 HISTORY — DX: Chronic kidney disease, unspecified: N18.9

## 2012-12-21 HISTORY — PX: CORONARY ARTERY BYPASS GRAFT: SHX141

## 2013-07-30 ENCOUNTER — Other Ambulatory Visit: Payer: Self-pay | Admitting: Orthopedic Surgery

## 2013-09-06 ENCOUNTER — Encounter (HOSPITAL_BASED_OUTPATIENT_CLINIC_OR_DEPARTMENT_OTHER): Payer: Self-pay | Admitting: *Deleted

## 2013-09-06 NOTE — Progress Notes (Signed)
Has been here 5 times-had cabg in [place] 9/14-called cardiology for notes-had ekg-will need istat and pt

## 2013-09-07 NOTE — H&P (Signed)
Adam Shannon is an 73 y.o. male.   Chief Complaint: Left Great Toe Pain  HPI: Patient presents with a chief complaint of bilateral knee pain and left great toe pain.  The patient states that he cannot recall an injury.  He has noticed that his foot pain comes and goes with the change in footwear.  Patient denies any history of gout.  Today, he states the pain comes and goes and is severe at times.  He describes a sharp pain.  He denies any swelling numbness tingling or weakness.  His symptoms are getting worse.  It does not wake him from sleep.  His symptoms are worse with with wearing shoes and better with removing issues.  He does have a history of bilateral knee replacements is also here today to receive x-rays out for bilateral knees.  Patient does have a history of right great toe cheilectomy.  Past Medical History  Diagnosis Date  . Coronary artery disease   . Myocardial infarction   . Hyperlipemia   . Hypertension   . Arthritis   . Seasonal allergies   . Wears hearing aid     both ears  . Wears glasses     reading only    Past Surgical History  Procedure Laterality Date  . Coronary angioplasty  1998    stent lad  . Shoulder arthroscopy  2012    left  . Joint replacement      lt total knee  . Total knee arthroplasty  2012    rt   . Thumb arthroscopy  2007  . Great toe arthrodesis, interphalangeal joint  2009    rt  . Ankle surgery  2011    lt  . Shoulder arthroscopy  2004    rt  . Tonsillectomy    . Lumbar laminectomy  2013    repeat for leaking spinal fluid  . Ligament repair  12/11/2011    Procedure: LIGAMENT REPAIR;  Surgeon: Kerin Salen, MD;  Location: [facility];  Service: Orthopedics;  Laterality: Left;  left thumb anchovoy procedure with suspensionplasty  . Coronary artery bypass graft  9/14    [place] [facility]  . Colon surgery      age 66-colon repair    History reviewed. No pertinent family history. Social History:  reports that he has never  smoked. He does not have any smokeless tobacco history on file. He reports that he drinks alcohol. He reports that he does not use illicit drugs.  Allergies:  Allergies  Allergen Reactions  . Darvocet [Propoxyphene N-Acetaminophen]     Cannot sleep  . Hydrocodone     keeps him awake  . Oxycodone     Muscle spasms    No prescriptions prior to admission    No results found for this or any previous visit (from the past 48 hour(s)). No results found.  Review of Systems  Constitutional: Negative.   HENT: Positive for hearing loss.   Respiratory: Negative.   Cardiovascular: Negative.   Gastrointestinal: Negative.   Genitourinary: Negative.   Musculoskeletal: Positive for joint pain.  Skin: Negative.   Neurological: Negative.   Endo/Heme/Allergies: Negative.   Psychiatric/Behavioral: Negative.     Height 5\' 9"  (1.753 m), weight 87.091 kg (192 lb). Physical Exam  Constitutional: He is oriented to person, place, and time. He appears well-developed and well-nourished.  HENT:  Head: Normocephalic and atraumatic.  Eyes: Pupils are equal, round, and reactive to light.  Neck: Normal range of  motion. Neck supple.  Cardiovascular: Intact distal pulses.   Respiratory: Effort normal.  Musculoskeletal: He exhibits tenderness.   Patient's knees have good strength and good range of motion.  He does have scars consistent with previous total knee arthroplasties.  No noticeable effusion and no erythema or warmth.  He does have mild edema bilaterally in his lower legs.  Patient has no pain with palpation of the right great toe and first MTP joint.  Patient's left first MTP joint has obvious irritation and pain with palpation.  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  He has no erythema or warmth and no signs of infection.  His calves are soft and nontender.  He is neurovascularly intact distally.    Neurological: He is alert and oriented to person, place, and time.  Skin: Skin is warm and dry.   Psychiatric: He has a normal mood and affect. His behavior is normal. Judgment and thought content normal.     Assessment/Plan  Assess: Status post bilateral knee replacement                 Arthritis left first MTP joint  Plan: This patient is discussed with Dr. Mayer Camel who also reviewed the patient's x-rays in discusses treatment options.  Patient has agreed and consented to a cheilectomy of the left first MTP joint.  He is made aware the benefits risks and potential competitions of the surgery.  A posting slip is completed and the patient will call Sandi Raveling to schedule the procedure.  Patient is cautioned that he is currently very close to an upcoming trip in approximately 5 weeks.  He is to call with any issues.  If this patient elects not to have surgery consider cortisone injection in the first MTP joint to get him through his trip.  Leighton Parody 09/07/2013, 4:09 PM

## 2013-09-08 ENCOUNTER — Encounter (HOSPITAL_BASED_OUTPATIENT_CLINIC_OR_DEPARTMENT_OTHER): Payer: Self-pay | Admitting: Certified Registered"

## 2013-09-08 ENCOUNTER — Encounter (HOSPITAL_BASED_OUTPATIENT_CLINIC_OR_DEPARTMENT_OTHER): Payer: Medicare Other | Admitting: Certified Registered"

## 2013-09-08 ENCOUNTER — Ambulatory Visit (HOSPITAL_BASED_OUTPATIENT_CLINIC_OR_DEPARTMENT_OTHER)
Admission: RE | Admit: 2013-09-08 | Discharge: 2013-09-08 | Disposition: A | Discharge status: Home / Self Care | Payer: Medicare Other | Source: Ambulatory Visit | Attending: Orthopedic Surgery | Admitting: Orthopedic Surgery

## 2013-09-08 ENCOUNTER — Ambulatory Visit (HOSPITAL_BASED_OUTPATIENT_CLINIC_OR_DEPARTMENT_OTHER): Payer: Medicare Other | Admitting: Certified Registered"

## 2013-09-08 ENCOUNTER — Encounter (HOSPITAL_BASED_OUTPATIENT_CLINIC_OR_DEPARTMENT_OTHER)
Admission: RE | Disposition: A | Discharge status: Home / Self Care | Payer: Self-pay | Source: Ambulatory Visit | Attending: Orthopedic Surgery

## 2013-09-08 DIAGNOSIS — I4891 Unspecified atrial fibrillation: Secondary | ICD-10-CM | POA: Insufficient documentation

## 2013-09-08 DIAGNOSIS — M2020 Hallux rigidus, unspecified foot: Secondary | ICD-10-CM | POA: Insufficient documentation

## 2013-09-08 DIAGNOSIS — I2510 Atherosclerotic heart disease of native coronary artery without angina pectoris: Secondary | ICD-10-CM | POA: Insufficient documentation

## 2013-09-08 DIAGNOSIS — I10 Essential (primary) hypertension: Secondary | ICD-10-CM | POA: Insufficient documentation

## 2013-09-08 DIAGNOSIS — Z951 Presence of aortocoronary bypass graft: Secondary | ICD-10-CM | POA: Insufficient documentation

## 2013-09-08 DIAGNOSIS — I252 Old myocardial infarction: Secondary | ICD-10-CM | POA: Insufficient documentation

## 2013-09-08 DIAGNOSIS — Z7901 Long term (current) use of anticoagulants: Secondary | ICD-10-CM | POA: Insufficient documentation

## 2013-09-08 DIAGNOSIS — Z96659 Presence of unspecified artificial knee joint: Secondary | ICD-10-CM | POA: Insufficient documentation

## 2013-09-08 DIAGNOSIS — M19079 Primary osteoarthritis, unspecified ankle and foot: Secondary | ICD-10-CM

## 2013-09-08 HISTORY — PX: BUNIONECTOMY: SHX129

## 2013-09-08 LAB — POCT I-STAT, CHEM 8
BUN: 20 mg/dL (ref 6–23)
CREATININE: 0.9 mg/dL (ref 0.50–1.35)
Calcium, Ion: 1.27 mmol/L (ref 1.13–1.30)
Chloride: 109 mEq/L (ref 96–112)
Glucose, Bld: 84 mg/dL (ref 70–99)
HCT: 47 % (ref 39.0–52.0)
Hemoglobin: 16 g/dL (ref 13.0–17.0)
Potassium: 4.1 mEq/L (ref 3.7–5.3)
SODIUM: 142 meq/L (ref 137–147)
TCO2: 22 mmol/L (ref 0–100)

## 2013-09-08 LAB — APTT: APTT: 38 s — AB (ref 24–37)

## 2013-09-08 LAB — PROTIME-INR
INR: 2.23 — AB (ref 0.00–1.49)
Prothrombin Time: 24 seconds — ABNORMAL HIGH (ref 11.6–15.2)

## 2013-09-08 SURGERY — BUNIONECTOMY
Anesthesia: General | Site: Toe | Laterality: Left

## 2013-09-08 MED ORDER — DEXTROSE-NACL 5-0.45 % IV SOLN: INTRAVENOUS | Status: DC

## 2013-09-08 MED ORDER — CEFAZOLIN SODIUM-DEXTROSE 2-3 GM-% IV SOLR
INTRAVENOUS | Status: AC
  Filled 2013-09-08: qty 50

## 2013-09-08 MED ORDER — PROMETHAZINE HCL 25 MG/ML IJ SOLN: 6.2500 mg | INTRAMUSCULAR | Status: DC | PRN

## 2013-09-08 MED ORDER — CHLORHEXIDINE GLUCONATE 4 % EX LIQD: 60.0000 mL | Freq: Once | CUTANEOUS | Status: DC

## 2013-09-08 MED ORDER — MEPERIDINE HCL 25 MG/ML IJ SOLN: 6.2500 mg | INTRAMUSCULAR | Status: DC | PRN

## 2013-09-08 MED ORDER — CEFAZOLIN SODIUM-DEXTROSE 2-3 GM-% IV SOLR
2.0000 g | INTRAVENOUS | Status: AC
  Administered 2013-09-08: 2 g via INTRAVENOUS

## 2013-09-08 MED ORDER — FENTANYL CITRATE 0.05 MG/ML IJ SOLN
INTRAMUSCULAR | Status: AC
  Filled 2013-09-08: qty 6

## 2013-09-08 MED ORDER — GLYCOPYRROLATE 0.2 MG/ML IJ SOLN
INTRAMUSCULAR | Status: DC | PRN
  Administered 2013-09-08: 0.2 mg via INTRAVENOUS

## 2013-09-08 MED ORDER — HYDROMORPHONE HCL 2 MG PO TABS: 2.0000 mg | ORAL_TABLET | ORAL | Status: DC | PRN

## 2013-09-08 MED ORDER — LACTATED RINGERS IV SOLN
INTRAVENOUS | Status: DC
  Administered 2013-09-08 (×2): via INTRAVENOUS

## 2013-09-08 MED ORDER — FENTANYL CITRATE 0.05 MG/ML IJ SOLN
INTRAMUSCULAR | Status: DC | PRN
  Administered 2013-09-08 (×2): 50 ug via INTRAVENOUS

## 2013-09-08 MED ORDER — BUPIVACAINE HCL (PF) 0.5 % IJ SOLN
INTRAMUSCULAR | Status: DC | PRN
  Administered 2013-09-08: 10 mL

## 2013-09-08 MED ORDER — FENTANYL CITRATE 0.05 MG/ML IJ SOLN: 50.0000 ug | INTRAMUSCULAR | Status: DC | PRN

## 2013-09-08 MED ORDER — LIDOCAINE HCL (CARDIAC) 20 MG/ML IV SOLN
INTRAVENOUS | Status: DC | PRN
  Administered 2013-09-08: 2 mg via INTRAVENOUS

## 2013-09-08 MED ORDER — HYDROMORPHONE HCL PF 1 MG/ML IJ SOLN: 0.2500 mg | INTRAMUSCULAR | Status: DC | PRN

## 2013-09-08 MED ORDER — PROPOFOL 10 MG/ML IV BOLUS
INTRAVENOUS | Status: DC | PRN
  Administered 2013-09-08: 100 mg via INTRAVENOUS
  Administered 2013-09-08: 40 mg via INTRAVENOUS
  Administered 2013-09-08: 100 mg via INTRAVENOUS

## 2013-09-08 MED ORDER — OXYCODONE HCL 5 MG PO TABS: 5.0000 mg | ORAL_TABLET | Freq: Once | ORAL | Status: DC | PRN

## 2013-09-08 MED ORDER — BUPIVACAINE HCL (PF) 0.5 % IJ SOLN
INTRAMUSCULAR | Status: AC
  Filled 2013-09-08: qty 30

## 2013-09-08 MED ORDER — ONDANSETRON HCL 4 MG/2ML IJ SOLN
INTRAMUSCULAR | Status: DC | PRN
  Administered 2013-09-08: 10 mg via INTRAVENOUS
  Administered 2013-09-08: 4 mg via INTRAVENOUS

## 2013-09-08 MED ORDER — OXYCODONE HCL 5 MG/5ML PO SOLN: 5.0000 mg | Freq: Once | ORAL | Status: DC | PRN

## 2013-09-08 MED ORDER — LIDOCAINE HCL (PF) 1 % IJ SOLN
INTRAMUSCULAR | Status: AC
  Filled 2013-09-08: qty 30

## 2013-09-08 MED ORDER — MIDAZOLAM HCL 2 MG/2ML IJ SOLN: 1.0000 mg | INTRAMUSCULAR | Status: DC | PRN

## 2013-09-08 MED ORDER — EPHEDRINE SULFATE 50 MG/ML IJ SOLN
INTRAMUSCULAR | Status: DC | PRN
  Administered 2013-09-08 (×2): 10 mg via INTRAVENOUS

## 2013-09-08 SURGICAL SUPPLY — 61 items
BANDAGE ELASTIC 4 VELCRO ST LF (GAUZE/BANDAGES/DRESSINGS) ×6 IMPLANT
BLADE 10 SAFETY STRL DISP (BLADE) IMPLANT
BLADE 15 SAFETY STRL DISP (BLADE) ×6 IMPLANT
BLADE OSC/SAG .038X5.5 CUT EDG (BLADE) ×3 IMPLANT
BNDG ESMARK 4X9 LF (GAUZE/BANDAGES/DRESSINGS) ×3 IMPLANT
BOOT STEPPER DURA LG (SOFTGOODS) IMPLANT
BOOT STEPPER DURA MED (SOFTGOODS) IMPLANT
BOOT STEPPER DURA SM (SOFTGOODS) IMPLANT
CANISTER SUCT 1200ML W/VALVE (MISCELLANEOUS) IMPLANT
COVER MAYO STAND STRL (DRAPES) ×3 IMPLANT
COVER TABLE BACK 60X90 (DRAPES) ×3 IMPLANT
CUFF TOURNIQUET SINGLE 18IN (TOURNIQUET CUFF) ×3 IMPLANT
CUFF TOURNIQUET SINGLE 34IN LL (TOURNIQUET CUFF) IMPLANT
DECANTER SPIKE VIAL GLASS SM (MISCELLANEOUS) IMPLANT
DRAPE EXTREMITY T 121X128X90 (DRAPE) ×3 IMPLANT
DRAPE OEC MINIVIEW 54X84 (DRAPES) IMPLANT
DRAPE SURG 17X23 STRL (DRAPES) ×3 IMPLANT
DRAPE U 20/CS (DRAPES) IMPLANT
DURAPREP 26ML APPLICATOR (WOUND CARE) ×3 IMPLANT
ELECT REM PT RETURN 9FT ADLT (ELECTROSURGICAL) ×3
ELECTRODE REM PT RTRN 9FT ADLT (ELECTROSURGICAL) ×1 IMPLANT
GAUZE SPONGE 4X4 16PLY XRAY LF (GAUZE/BANDAGES/DRESSINGS) IMPLANT
GAUZE XEROFORM 1X8 LF (GAUZE/BANDAGES/DRESSINGS) ×3 IMPLANT
GLOVE BIO SURGEON STRL SZ 6.5 (GLOVE) ×2 IMPLANT
GLOVE BIO SURGEON STRL SZ7.5 (GLOVE) ×3 IMPLANT
GLOVE BIO SURGEON STRL SZ8.5 (GLOVE) IMPLANT
GLOVE BIO SURGEONS STRL SZ 6.5 (GLOVE) ×1
GLOVE BIOGEL PI IND STRL 7.0 (GLOVE) ×1 IMPLANT
GLOVE BIOGEL PI IND STRL 8 (GLOVE) ×1 IMPLANT
GLOVE BIOGEL PI IND STRL 9 (GLOVE) ×1 IMPLANT
GLOVE BIOGEL PI INDICATOR 7.0 (GLOVE) ×2
GLOVE BIOGEL PI INDICATOR 8 (GLOVE) ×2
GLOVE BIOGEL PI INDICATOR 9 (GLOVE) ×2
GLOVE SURG SS PI 7.0 STRL IVOR (GLOVE) ×3 IMPLANT
GOWN STRL REUS W/ TWL LRG LVL3 (GOWN DISPOSABLE) ×2 IMPLANT
GOWN STRL REUS W/ TWL XL LVL3 (GOWN DISPOSABLE) ×2 IMPLANT
GOWN STRL REUS W/TWL LRG LVL3 (GOWN DISPOSABLE) ×4
GOWN STRL REUS W/TWL XL LVL3 (GOWN DISPOSABLE) ×4
NEEDLE HYPO 22GX1.5 SAFETY (NEEDLE) ×3 IMPLANT
NS IRRIG 1000ML POUR BTL (IV SOLUTION) ×3 IMPLANT
PACK BASIN DAY SURGERY FS (CUSTOM PROCEDURE TRAY) ×3 IMPLANT
PAD CAST 4YDX4 CTTN HI CHSV (CAST SUPPLIES) ×1 IMPLANT
PADDING CAST ABS 4INX4YD NS (CAST SUPPLIES) ×2
PADDING CAST ABS COTTON 4X4 ST (CAST SUPPLIES) ×1 IMPLANT
PADDING CAST COTTON 4X4 STRL (CAST SUPPLIES) ×2
PENCIL BUTTON HOLSTER BLD 10FT (ELECTRODE) ×3 IMPLANT
SLEEVE SCD COMPRESS KNEE MED (MISCELLANEOUS) IMPLANT
SPONGE LAP 18X18 X RAY DECT (DISPOSABLE) IMPLANT
SUCTION FRAZIER TIP 10 FR DISP (SUCTIONS) IMPLANT
SUT ETHILON 3 0 PS 1 (SUTURE) IMPLANT
SUT ETHILON 4 0 PS 2 18 (SUTURE) ×3 IMPLANT
SUT TICRON 1 T 12 (SUTURE) IMPLANT
SUT VIC AB 2-0 SH 27 (SUTURE)
SUT VIC AB 2-0 SH 27XBRD (SUTURE) IMPLANT
SYR BULB 3OZ (MISCELLANEOUS) ×3 IMPLANT
SYR CONTROL 10ML LL (SYRINGE) ×3 IMPLANT
TOWEL OR 17X24 6PK STRL BLUE (TOWEL DISPOSABLE) ×3 IMPLANT
TUBE CONNECTING 20'X1/4 (TUBING)
TUBE CONNECTING 20X1/4 (TUBING) IMPLANT
UNDERPAD 30X30 INCONTINENT (UNDERPADS AND DIAPERS) ×3 IMPLANT
YANKAUER SUCT BULB TIP NO VENT (SUCTIONS) IMPLANT

## 2013-09-08 NOTE — Discharge Instructions (Signed)

## 2013-09-08 NOTE — Progress Notes (Signed)
Dr. Mayer Camel notified of lab results

## 2013-09-08 NOTE — Anesthesia Preprocedure Evaluation (Addendum)
Anesthesia Evaluation  Patient identified by MRN, date of birth, ID band Patient awake    Reviewed: Allergy & Precautions, H&P , NPO status , Patient's Chart, lab work & pertinent test results, reviewed documented beta blocker date and time   History of Anesthesia Complications Negative for: history of anesthetic complications  Airway Mallampati: II TM Distance: >3 FB Neck ROM: Full    Dental  (+) Caps, Dental Advisory Given   Pulmonary former smoker,  breath sounds clear to auscultation  Pulmonary exam normal       Cardiovascular hypertension, Pt. on medications and Pt. on home beta blockers - angina+ CAD, + Past MI, + Cardiac Stents and + CABG (2014) + dysrhythmias (coumadin) Atrial Fibrillation Rhythm:Regular Rate:Normal     Neuro/Psych negative neurological ROS     GI/Hepatic negative GI ROS, Neg liver ROS,   Endo/Other  negative endocrine ROS  Renal/GU negative Renal ROS     Musculoskeletal   Abdominal   Peds  Hematology  (+) Blood dyscrasia (coumadin: INR 2.23), ,   Anesthesia Other Findings   Reproductive/Obstetrics                        Anesthesia Physical Anesthesia Plan  ASA: III  Anesthesia Plan: General   Post-op Pain Management:    Induction: Intravenous  Airway Management Planned: LMA  Additional Equipment:   Intra-op Plan:   Post-operative Plan: Extubation in OR  Informed Consent: I have reviewed the patients History and Physical, chart, labs and discussed the procedure including the risks, benefits and alternatives for the proposed anesthesia with the patient or authorized representative who has indicated his/her understanding and acceptance.   Dental advisory given  Plan Discussed with: CRNA and Surgeon  Anesthesia Plan Comments: (Plan routine monitors, GA- LMA OK)        Anesthesia Quick Evaluation

## 2013-09-08 NOTE — Transfer of Care (Signed)
Immediate Anesthesia Transfer of Care Note  Patient: Adam Shannon  Procedure(s) Performed: Procedure(s): LEFT 1ST MTP JOINT CHILECTOMY  (Left)  Patient Location: PACU  Anesthesia Type:General  Level of Consciousness: awake, alert , oriented and patient cooperative  Airway & Oxygen Therapy: Patient Spontanous Breathing and Patient connected to face mask oxygen  Post-op Assessment: Report given to PACU RN and Post -op Vital signs reviewed and stable  Post vital signs: Reviewed and stable  Complications: No apparent anesthesia complications

## 2013-09-08 NOTE — Anesthesia Procedure Notes (Signed)
Procedure Name: LMA Insertion Date/Time: 09/08/2013 11:49 AM Performed by: Lyndee Leo Pre-anesthesia Checklist: Patient identified, Emergency Drugs available, Suction available and Patient being monitored Patient Re-evaluated:Patient Re-evaluated prior to inductionOxygen Delivery Method: Circle System Utilized Preoxygenation: Pre-oxygenation with 100% oxygen Intubation Type: IV induction Ventilation: Mask ventilation without difficulty LMA: LMA inserted LMA Size: 5.0 Number of attempts: 1 Airway Equipment and Method: bite block Placement Confirmation: positive ETCO2 Tube secured with: Tape Dental Injury: Teeth and Oropharynx as per pre-operative assessment

## 2013-09-08 NOTE — Op Note (Signed)
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 Barton Dubois  (present throughout entire procedure and necessary for timely completion of the procedure)  Anesthesia: General  EBL: Minimal  Fluids: 800 cc of crystalloid  Tourniquet Time: 15 minutes  Indications: 73 year old man with left great toe hallux rigidus who is failed conservative treatment with anti-inflammatory medicines cortisone injections and observation. He had a successful cheilectomy on the right side over 5 years ago desires same for the left. He is well aware of the risks and benefits which were discussed and reinforced.  Procedure: Patient identified by arm band and taken to operating room 5 at the [facility] a progress that monitors were attached and general LMA anesthesia induced with the patient in the supine position. Tourniquet applied to the left ankle and left foot prepped and draped in usual fashion from the toes to the tourniquet. A timeout procedure was performed. The foot was wrapped with an Esmarch bandage and the tourniquet inflated to 250 mm of mercury. A dorsolateral longitudinal incision centered over the MTP joint was made 4 cm in length the skin and subcutaneous tissue we then incised the capsule in line with the skin incision. The capsule was reflected dorsally off the distal metatarsal and proximal phalanx exposing the dorsal osteophyte as well as part of the ossified was broken off and actually in the joint. This ossicle which was 1 cm x 4 mm x 5 mm was removed we then used the small anterior cruciate ligament saw to perform a cheilectomy of the dorsal osteophyte of the distal metatarsal and proximal phalanx. Stann Mainland were used to smooth off the edges and the patient immediately had the ability to dorsiflex to 60 preoperatively he had 10 of dorsiflexion possible. The wound is irrigated  out normal saline solution the tourniquet let down small bleeders were cauterized with the bipolar and were careful to preserve any significant branches of the dorsolateral nerve. 10 cc of 1/2% Marcaine local anesthetic was infiltrated proximal dorsal and volar to the wound to provide local anesthetic. We then closed in layers with 4-0 Vicryl suture and the subcutaneous tissue and subcuticular. A dressing of Xerofoam 4 x 4 dressing sponges 2 inch web roll 3 inch Ace wrap and a postop shoe was applied. The patient was awakened and taken to the recovery without difficulty.

## 2013-09-08 NOTE — Interval H&P Note (Signed)
History and Physical Interval Note:  09/08/2013 11:38 AM  Adam Shannon  has presented today for surgery, with the diagnosis of LEFT FOOT [name]   LEFT GREAT TOE   The various methods of treatment have been discussed with the patient and family. After consideration of risks, benefits and other options for treatment, the patient has consented to  Procedure(s): LEFT 1ST MTP JOINT CHILECTOMY  (Left) as a surgical intervention .  The patient's history has been reviewed, patient examined, no change in status, stable for surgery.  I have reviewed the patient's chart and labs.  Questions were answered to the patient's satisfaction.     Kerin Salen

## 2013-09-08 NOTE — Anesthesia Postprocedure Evaluation (Signed)
  Anesthesia Post-op Note  Patient: Adam Shannon  Procedure(s) Performed: Procedure(s): LEFT 1ST MTP JOINT CHILECTOMY  (Left)  Patient Location: PACU  Anesthesia Type:General  Level of Consciousness: awake, alert , oriented and patient cooperative  Airway and Oxygen Therapy: Patient Spontanous Breathing  Post-op Pain: none  Post-op Assessment: Post-op Vital signs reviewed, Patient's Cardiovascular Status Stable, Respiratory Function Stable, Patent Airway, No signs of Nausea or vomiting, Adequate PO intake and Pain level controlled  Post-op Vital Signs: Reviewed and stable  Last Vitals:  Filed Vitals:   09/08/13 1340  BP: 120/74  Pulse: 55  Temp: 36.4 C  Resp: 16    Complications: No apparent anesthesia complications

## 2013-09-09 ENCOUNTER — Encounter (HOSPITAL_BASED_OUTPATIENT_CLINIC_OR_DEPARTMENT_OTHER): Payer: Self-pay | Admitting: Orthopedic Surgery

## 2013-09-09 NOTE — Addendum Note (Signed)
Addendum created 09/09/13 1316 by Tawni Millers, CRNA   Modules edited: Charges VN

## 2013-09-16 ENCOUNTER — Inpatient Hospital Stay (HOSPITAL_COMMUNITY)
Admission: AD | Admit: 2013-09-16 | Discharge: 2013-09-19 | DRG: 920 | Disposition: A | Discharge status: Home / Self Care | Payer: Medicare Other | Source: Ambulatory Visit | Attending: Orthopedic Surgery | Admitting: Orthopedic Surgery

## 2013-09-16 ENCOUNTER — Other Ambulatory Visit: Payer: Self-pay | Admitting: Orthopedic Surgery

## 2013-09-16 ENCOUNTER — Encounter (HOSPITAL_COMMUNITY): Payer: Self-pay

## 2013-09-16 DIAGNOSIS — L03119 Cellulitis of unspecified part of limb: Secondary | ICD-10-CM

## 2013-09-16 DIAGNOSIS — I2510 Atherosclerotic heart disease of native coronary artery without angina pectoris: Secondary | ICD-10-CM | POA: Diagnosis present

## 2013-09-16 DIAGNOSIS — I10 Essential (primary) hypertension: Secondary | ICD-10-CM | POA: Diagnosis present

## 2013-09-16 DIAGNOSIS — I252 Old myocardial infarction: Secondary | ICD-10-CM

## 2013-09-16 DIAGNOSIS — L02619 Cutaneous abscess of unspecified foot: Secondary | ICD-10-CM | POA: Diagnosis present

## 2013-09-16 DIAGNOSIS — L03116 Cellulitis of left lower limb: Secondary | ICD-10-CM | POA: Diagnosis present

## 2013-09-16 DIAGNOSIS — IMO0002 Reserved for concepts with insufficient information to code with codable children: Principal | ICD-10-CM | POA: Diagnosis present

## 2013-09-16 DIAGNOSIS — Y838 Other surgical procedures as the cause of abnormal reaction of the patient, or of later complication, without mention of misadventure at the time of the procedure: Secondary | ICD-10-CM | POA: Diagnosis present

## 2013-09-16 LAB — BASIC METABOLIC PANEL
BUN: 27 mg/dL — AB (ref 6–23)
CALCIUM: 10.4 mg/dL (ref 8.4–10.5)
CO2: 23 mEq/L (ref 19–32)
Chloride: 96 mEq/L (ref 96–112)
Creatinine, Ser: 1.11 mg/dL (ref 0.50–1.35)
GFR calc non Af Amer: 63 mL/min — ABNORMAL LOW (ref >90)
GFR, EST AFRICAN AMERICAN: 74 mL/min — AB (ref >90)
Glucose, Bld: 126 mg/dL — ABNORMAL HIGH (ref 70–99)
POTASSIUM: 4.6 meq/L (ref 3.7–5.3)
Sodium: 133 mEq/L — ABNORMAL LOW (ref 137–147)

## 2013-09-16 LAB — PROTIME-INR
INR: 1.92 — ABNORMAL HIGH (ref 0.00–1.49)
Prothrombin Time: 21.4 seconds — ABNORMAL HIGH (ref 11.6–15.2)

## 2013-09-16 MED ORDER — DIPHENHYDRAMINE HCL 50 MG/ML IJ SOLN: 12.5000 mg | Freq: Four times a day (QID) | INTRAMUSCULAR | Status: DC | PRN

## 2013-09-16 MED ORDER — HYDROCODONE-ACETAMINOPHEN 5-325 MG PO TABS
1.0000 | ORAL_TABLET | ORAL | Status: DC | PRN
  Filled 2013-09-16: qty 2

## 2013-09-16 MED ORDER — ONDANSETRON HCL 4 MG/2ML IJ SOLN: 4.0000 mg | Freq: Four times a day (QID) | INTRAMUSCULAR | Status: DC | PRN

## 2013-09-16 MED ORDER — WARFARIN - PHYSICIAN DOSING INPATIENT
Freq: Every day | Status: DC
  Administered 2013-09-18: 18:00:00

## 2013-09-16 MED ORDER — ASPIRIN 81 MG PO TABS: 81.0000 mg | ORAL_TABLET | Freq: Every day | ORAL | Status: DC

## 2013-09-16 MED ORDER — ADULT MULTIVITAMIN W/MINERALS CH
1.0000 | ORAL_TABLET | Freq: Every day | ORAL | Status: DC
  Administered 2013-09-17 – 2013-09-18 (×2): 1 via ORAL
  Filled 2013-09-16 (×3): qty 1

## 2013-09-16 MED ORDER — DIPHENHYDRAMINE HCL 12.5 MG/5ML PO ELIX: 12.5000 mg | ORAL_SOLUTION | Freq: Four times a day (QID) | ORAL | Status: DC | PRN

## 2013-09-16 MED ORDER — VANCOMYCIN HCL IN DEXTROSE 1-5 GM/200ML-% IV SOLN
1000.0000 mg | Freq: Once | INTRAVENOUS | Status: AC
  Administered 2013-09-16: 1000 mg via INTRAVENOUS
  Filled 2013-09-16: qty 200

## 2013-09-16 MED ORDER — WARFARIN SODIUM 4 MG PO TABS
4.0000 mg | ORAL_TABLET | ORAL | Status: DC
  Administered 2013-09-17: 4 mg via ORAL
  Filled 2013-09-16: qty 1

## 2013-09-16 MED ORDER — HYDROMORPHONE HCL PF 1 MG/ML IJ SOLN: 0.5000 mg | INTRAMUSCULAR | Status: DC | PRN

## 2013-09-16 MED ORDER — PIPERACILLIN-TAZOBACTAM 3.375 G IVPB
3.3750 g | Freq: Three times a day (TID) | INTRAVENOUS | Status: DC
  Administered 2013-09-16 – 2013-09-19 (×9): 3.375 g via INTRAVENOUS
  Filled 2013-09-16 (×11): qty 50

## 2013-09-16 MED ORDER — HYDROMORPHONE HCL 2 MG PO TABS
2.0000 mg | ORAL_TABLET | ORAL | Status: DC | PRN
  Administered 2013-09-16 – 2013-09-18 (×4): 2 mg via ORAL
  Filled 2013-09-16 (×4): qty 1

## 2013-09-16 MED ORDER — WARFARIN SODIUM 4 MG PO TABS
6.0000 mg | ORAL_TABLET | ORAL | Status: DC
  Administered 2013-09-18: 6 mg via ORAL
  Filled 2013-09-16 (×2): qty 1.5

## 2013-09-16 MED ORDER — DEXTROSE-NACL 5-0.45 % IV SOLN
INTRAVENOUS | Status: DC
Start: 2013-09-16 — End: 2013-09-19
  Administered 2013-09-16 – 2013-09-18 (×3): via INTRAVENOUS

## 2013-09-16 MED ORDER — LOSARTAN POTASSIUM 25 MG PO TABS
25.0000 mg | ORAL_TABLET | Freq: Every day | ORAL | Status: DC
  Administered 2013-09-17 – 2013-09-19 (×3): 25 mg via ORAL
  Filled 2013-09-16 (×3): qty 1

## 2013-09-16 MED ORDER — SENNOSIDES-DOCUSATE SODIUM 8.6-50 MG PO TABS: 1.0000 | ORAL_TABLET | Freq: Every evening | ORAL | Status: DC | PRN

## 2013-09-16 MED ORDER — ASPIRIN EC 81 MG PO TBEC
81.0000 mg | DELAYED_RELEASE_TABLET | Freq: Every day | ORAL | Status: DC
  Administered 2013-09-17 – 2013-09-18 (×2): 81 mg via ORAL
  Filled 2013-09-16 (×3): qty 1

## 2013-09-16 MED ORDER — OLOPATADINE HCL 0.6 % NA SOLN: 1.0000 | Freq: Every day | NASAL | Status: DC | PRN

## 2013-09-16 MED ORDER — DOCUSATE SODIUM 100 MG PO CAPS
100.0000 mg | ORAL_CAPSULE | Freq: Two times a day (BID) | ORAL | Status: DC
  Administered 2013-09-16 – 2013-09-18 (×4): 100 mg via ORAL
  Filled 2013-09-16 (×4): qty 1

## 2013-09-16 MED ORDER — PANTOPRAZOLE SODIUM 40 MG IV SOLR
40.0000 mg | Freq: Every day | INTRAVENOUS | Status: DC
  Administered 2013-09-16: 40 mg via INTRAVENOUS
  Filled 2013-09-16 (×2): qty 40

## 2013-09-16 MED ORDER — BISACODYL 5 MG PO TBEC: 5.0000 mg | DELAYED_RELEASE_TABLET | Freq: Every day | ORAL | Status: DC | PRN

## 2013-09-16 MED ORDER — METOPROLOL TARTRATE 12.5 MG HALF TABLET
12.5000 mg | ORAL_TABLET | Freq: Two times a day (BID) | ORAL | Status: DC
  Administered 2013-09-16 – 2013-09-19 (×6): 12.5 mg via ORAL
  Filled 2013-09-16 (×7): qty 1

## 2013-09-16 MED ORDER — VANCOMYCIN HCL IN DEXTROSE 1-5 GM/200ML-% IV SOLN
1000.0000 mg | Freq: Two times a day (BID) | INTRAVENOUS | Status: DC
  Administered 2013-09-17 – 2013-09-19 (×5): 1000 mg via INTRAVENOUS
  Filled 2013-09-16 (×6): qty 200

## 2013-09-16 MED ORDER — ATORVASTATIN CALCIUM 80 MG PO TABS
80.0000 mg | ORAL_TABLET | Freq: Every day | ORAL | Status: DC
  Administered 2013-09-16 – 2013-09-18 (×3): 80 mg via ORAL
  Filled 2013-09-16 (×5): qty 1

## 2013-09-16 MED ORDER — AZELASTINE HCL 0.1 % NA SOLN
1.0000 | Freq: Every day | NASAL | Status: DC | PRN
  Filled 2013-09-16: qty 30

## 2013-09-16 NOTE — H&P (Signed)
Adam Shannon is an 73 y.o. male.   Chief Complaint: Postoperative swelling, redness and pain left foot HPI:  Adam Shannon returns today with continued swelling and pain in his left foot that underwent great toe cheilectomy 09/08/2013.  The swelling and redness has gotten slightly worse, he still has no measured fever, no drainage from his wound and nothing that looks like a palpable abscess.   Past Medical History  Diagnosis Date  . Coronary artery disease   . Myocardial infarction   . Hyperlipemia   . Hypertension   . Arthritis   . Seasonal allergies   . Wears hearing aid     both ears  . Wears glasses     reading only    Past Surgical History  Procedure Laterality Date  . Coronary angioplasty  1998    stent lad  . Shoulder arthroscopy  2012    left  . Joint replacement      lt total knee  . Total knee arthroplasty  2012    rt   . Thumb arthroscopy  2007  . Great toe arthrodesis, interphalangeal joint  2009    rt  . Ankle surgery  2011    lt  . Shoulder arthroscopy  2004    rt  . Tonsillectomy    . Lumbar laminectomy  2013    repeat for leaking spinal fluid  . Ligament repair  12/11/2011    Procedure: LIGAMENT REPAIR;  Surgeon: Kerin Salen, MD;  Location: [place];  Service: Orthopedics;  Laterality: Left;  left thumb anchovoy procedure with suspensionplasty  . Coronary artery bypass graft  9/[place] [facility]  . Colon surgery      age 51-colon repair  . Bunionectomy Left 09/08/2013    Procedure: LEFT 1ST MTP JOINT CHILECTOMY ;  Surgeon: Kerin Salen, MD;  Location: [place];  Service: Orthopedics;  Laterality: Left;    No family history on file. Social History:  reports that he has never smoked. He does not have any smokeless tobacco history on file. He reports that he drinks alcohol. He reports that he does not use illicit drugs.  Allergies:  Allergies  Allergen Reactions  . Darvocet [Propoxyphene N-Acetaminophen]     Cannot sleep   . Hydrocodone     keeps him awake  . Oxycodone     Muscle spasms     (Not in a hospital admission)  No results found for this or any previous visit (from the past 48 hour(s)). No results found.  ROS: Patient denies chest pain, denies shortness of breath, reports his atrial fibrillation is stable.  There were no vitals taken for this visit. Physical Exam:  Well-nourished well-developed patient seated on the exam table in no obvious distress no shortness of breath.  The patient's temperature is 90.9, the erythema and swelling to the dorsum of the foot is slightly worse and while last saw him and his skin is tender to touch.  Interestingly, the surgical wound is well-healed there is no palpable effusion in the toe itself has minimal swelling.  We went ahead and got a CBC and sedimentation rate on him this morning with a white count of 7.4 and a sedimentation rate of 67.    Assessment/Plan  Assess: Left foot cellulitis, after fter great toe cheilectomy 8 days ago. R/O abscess or osteomyelitis.  Plan: We will get the patient admitted to [place] for an MRI scan today, IV antibiotics to  include vancomycin and Zosyn.  We'll reassess him once the MRI scan is been accomplished as an inpatient.  Kerin Salen 09/16/2013, 1:47 PM

## 2013-09-16 NOTE — Progress Notes (Signed)
ANTIBIOTIC CONSULT NOTE - INITIAL  Pharmacy Consult for Vancomycin Indication: Cellulitis  Allergies  Allergen Reactions  . Darvocet [Propoxyphene N-Acetaminophen]     Cannot sleep  . Hydrocodone     keeps him awake  . Oxycodone     Muscle spasms    Patient Measurements: Height: 5\' 9"  (175.3 cm) Weight: 193 lb 12.8 oz (87.907 kg) IBW/kg (Calculated) : 70.7 Adjusted Body Weight:   Vital Signs: Temp: 97.8 F (36.6 C) (05/28 1356) Temp src: Oral (05/28 1356) BP: 119/70 mmHg (05/28 1356) Pulse Rate: 61 (05/28 1356) Intake/Output from previous day:   Intake/Output from this shift: Total I/O In: -  Out: 200 [Urine:200]  Labs: No results found for this basename: WBC, HGB, PLT, LABCREA, CREATININE,  in the last 72 hours Estimated Creatinine Clearance: 79 ml/min (by C-G formula based on Cr of 0.9). No results found for this basename: VANCOTROUGH, VANCOPEAK, VANCORANDOM, GENTTROUGH, GENTPEAK, GENTRANDOM, TOBRATROUGH, TOBRAPEAK, TOBRARND, AMIKACINPEAK, AMIKACINTROU, AMIKACIN,  in the last 72 hours   Microbiology: No results found for this or any previous visit (from the past 720 hour(s)).  Medical History: Past Medical History  Diagnosis Date  . Coronary artery disease   . Myocardial infarction   . Hyperlipemia   . Hypertension   . Arthritis   . Seasonal allergies   . Wears hearing aid     both ears  . Wears glasses     reading only    Medications:  Scheduled:  . docusate sodium  100 mg Oral BID  . pantoprazole (PROTONIX) IV  40 mg Intravenous QHS  . piperacillin-tazobactam (ZOSYN)  IV  3.375 g Intravenous 3 times per day   Assessment: 73yo male with post-op swelling, redness, pain of L-foot, s/p great toe cheilectomy 09/08/2013.  His Cr is < 1, there is no cx data at this time.  No Vancomcyin has been administered   Goal of Therapy:  Vancomycin trough level 10-15 mcg/ml  Plan:  1-  Vancomycin 1000mg  iV q12 2-  F/U any cx data, renal fxn, and check trough  when appropriate  Gracy Bruins, PharmD [facility]

## 2013-09-17 ENCOUNTER — Inpatient Hospital Stay (HOSPITAL_COMMUNITY): Payer: Medicare Other

## 2013-09-17 LAB — PROTIME-INR
INR: 1.85 — ABNORMAL HIGH (ref 0.00–1.49)
PROTHROMBIN TIME: 20.8 s — AB (ref 11.6–15.2)

## 2013-09-17 MED ORDER — GADOBENATE DIMEGLUMINE 529 MG/ML IV SOLN
18.0000 mL | Freq: Once | INTRAVENOUS | Status: AC | PRN
  Administered 2013-09-17: 18 mL via INTRAVENOUS

## 2013-09-17 MED ORDER — PANTOPRAZOLE SODIUM 40 MG PO TBEC
40.0000 mg | DELAYED_RELEASE_TABLET | Freq: Every day | ORAL | Status: DC
  Administered 2013-09-17 – 2013-09-18 (×2): 40 mg via ORAL
  Filled 2013-09-17 (×2): qty 1

## 2013-09-17 NOTE — Progress Notes (Signed)
PATIENT ID: Adam Shannon  MRN: [id number]  DOB/AGE:  75/30/1942 / 75 y.o.   S/p left foot cheilectomy on 09/08/13 now with cellulitis    PROGRESS NOTE Subjective:   Patient is alert, oriented, no Nausea, no Vomiting, yes passing gas, no Bowel Movement. Taking PO well. Denies SOB, Chest or Calf Pain. Using Incentive Spirometer, PAS in place. Ambulate WBAT, Patient reports pain as mild.  Pt does continue to report gravity dependent swelling.    Objective: Vital signs in last 24 hours: Temp:  [97.7 F (36.5 C)-98 F (36.7 C)] 98 F (36.7 C) (05/29 0900) Pulse Rate:  [59-73] 63 (05/29 0900) Resp:  [16-18] 16 (05/29 0900) BP: (102-131)/(59-73) 105/72 mmHg (05/29 0900) SpO2:  [97 %-100 %] 100 % (05/29 0900) Weight:  [87.907 kg (193 lb 12.8 oz)] 87.907 kg (193 lb 12.8 oz) (05/28 1356)    Intake/Output from previous day: I/O last 3 completed shifts: In: -  Out: 800 [Urine:800]   Intake/Output this shift: Total I/O In: -  Out: 600 [Urine:600]   LABORATORY DATA:  Recent Labs  09/16/13 1619 09/17/13 0913  NA 133*  --   K 4.6  --   CL 96  --   CO2 23  --   BUN 27*  --   CREATININE 1.11  --   GLUCOSE 126*  --   INR 1.92* 1.85*  CALCIUM 10.4  --     Examination: Neurologically intact Neurovascular intact Sensation intact distally Intact pulses distally Dorsiflexion/Plantar flexion intact Incision: dressing C/D/I and no drainage} Pt continues to have edema throughout the foot. Assessment:   S/p left foot cheilectomy on 09/08/13 now with cellulitis and edema  ADDITIONAL DIAGNOSIS:  Hypertension and hx of MI and CAD  Plan:  Weight Bearing as Tolerated (WBAT)  DVT Prophylaxis:  Coumadin  DISCHARGE PLAN: Home  We are awaiting the results of MRI of the left foot  Continue IV antibiotics including Vancomycin and Zosyn     Leighton Parody 09/17/2013, 10:41 AM

## 2013-09-18 LAB — PROTIME-INR
INR: 1.53 — AB (ref 0.00–1.49)
PROTHROMBIN TIME: 18 s — AB (ref 11.6–15.2)

## 2013-09-18 MED ORDER — TRAMADOL HCL 50 MG PO TABS
50.0000 mg | ORAL_TABLET | Freq: Four times a day (QID) | ORAL | Status: DC | PRN
  Administered 2013-09-18: 50 mg via ORAL
  Filled 2013-09-18: qty 1

## 2013-09-18 NOTE — Progress Notes (Signed)
   PATIENT ID: Adam Shannon        Subjective: Says the foot feels very good when elevated. Still hurts when it comes down. Noted some improvement of the last 24 hours.  Objective:  Filed Vitals:   09/18/13 0508  BP: 118/68  Pulse: 54  Temp: 97.6 F (36.4 C)  Resp: 16     I unwrapped the foot today. Swelling is much improved according to his wife. The incision looks great. He still has a little bit of soft tissue swelling and mild erythema over the dorsolateral aspect of the foot.  Labs:  No results found for this basename: HGB,  in the last 72 hoursNo results found for this basename: WBC, RBC, HCT, PLT,  in the last 72 hours Recent Labs  09/16/13 1619  NA 133*  K 4.6  CL 96  CO2 23  BUN 27*  CREATININE 1.11  GLUCOSE 126*  CALCIUM 10.4    Assessment and Plan: Resolving postoperative hematoma/cellulitis I went over his MRI findings with Dr. Mayer Camel. They are reassuring. It does not look like anything surgical at this point. He is improving with IV antibiotics and elevation. He lives 2 hours away from here so I think it would be best if we kept him overnight one more night with the broad-spectrum antibiotics and assuming he continues to improve we'll then transition to oral antibiotics and discharge home. We'll reassess in the morning.

## 2013-09-19 LAB — PROTIME-INR
INR: 1.48 (ref 0.00–1.49)
Prothrombin Time: 17.5 seconds — ABNORMAL HIGH (ref 11.6–15.2)

## 2013-09-19 MED ORDER — CEPHALEXIN 500 MG PO CAPS: 500.0000 mg | ORAL_CAPSULE | Freq: Four times a day (QID) | ORAL | Status: DC

## 2013-09-19 MED ORDER — TRAMADOL HCL 50 MG PO TABS: 50.0000 mg | ORAL_TABLET | Freq: Four times a day (QID) | ORAL | Status: DC | PRN

## 2013-09-19 NOTE — Progress Notes (Signed)
   PATIENT ID: Adam Shannon        Subjective: Patient reports doing even better today. Still notes that when dependent the foot gets quite red and more swollen.  Objective:  Filed Vitals:   09/19/13 0507  BP: 111/62  Pulse: 50  Temp: 97.6 F (36.4 C)  Resp: 16     When elevated the foot looks very good. There is mild soft tissue edema. Very minimal erythema. No fluctuance. When dependent he does have more erythema of the foot and increased swelling.  Labs:  No results found for this basename: HGB,  in the last 72 hoursNo results found for this basename: WBC, RBC, HCT, PLT,  in the last 72 hours Recent Labs  09/16/13 1619  NA 133*  K 4.6  CL 96  CO2 23  BUN 27*  CREATININE 1.11  GLUCOSE 126*  CALCIUM 10.4    Assessment and Plan: Resolving cellulitis /hematoma At this point we can transition him to oral antibiotics and discharge home. Recommend strict elevation over the next several days and as things improve he can start to do more. He will followup in one week with Dr. Mayer Camel for recheck and will call or return sooner with any worsening. He'll continue his Coumadin protocol.

## 2013-09-19 NOTE — Discharge Summary (Signed)
Patient given discharge paperwork. Reviewed follow up appointment. Prescriptions given to patient. My Chart reviewed with patient. Patient is ready to be discharged.

## 2013-09-19 NOTE — Discharge Instructions (Signed)
Elevate on pillows

## 2013-10-06 ENCOUNTER — Ambulatory Visit (HOSPITAL_COMMUNITY)
Admission: RE | Admit: 2013-10-06 | Discharge: 2013-10-06 | Disposition: A | Discharge status: Home / Self Care | Payer: Medicare Other | Source: Ambulatory Visit | Attending: Orthopaedic Surgery | Admitting: Orthopaedic Surgery

## 2013-10-06 ENCOUNTER — Other Ambulatory Visit (HOSPITAL_COMMUNITY): Payer: Self-pay | Admitting: Orthopaedic Surgery

## 2013-10-06 DIAGNOSIS — M25569 Pain in unspecified knee: Secondary | ICD-10-CM

## 2013-10-06 DIAGNOSIS — M79609 Pain in unspecified limb: Secondary | ICD-10-CM

## 2013-10-06 NOTE — Progress Notes (Signed)
Left lower extremity venous duplex completed.  Left:  No evidence of DVT, superficial thrombosis, or Baker's cyst.  Right:  Negative for DVT in the common femoral vein.  

## 2013-10-07 ENCOUNTER — Encounter (HOSPITAL_COMMUNITY): Payer: Self-pay | Admitting: Pharmacy Technician

## 2013-10-07 ENCOUNTER — Other Ambulatory Visit: Payer: Self-pay | Admitting: Orthopedic Surgery

## 2013-10-07 ENCOUNTER — Encounter (HOSPITAL_COMMUNITY): Payer: Self-pay | Admitting: *Deleted

## 2013-10-07 NOTE — Progress Notes (Signed)
I faxed a request to [facility] requesting chest Xray an D/c notes from 9/14, patient was there for a CABG  I requested last office notes, stress test, echo from Dr Salli Real, patients cardiologist.  Adam Shannon reported that Dr Mayer Camel said to not stop Coumadin.

## 2013-10-07 NOTE — H&P (Signed)
Adam Shannon is an 73 y.o. male.   Chief Complaint: Left Foot Pain  HPI: Adam Shannon was seen yesterday by Dr. Rhona Raider for a 2-3 day history of increased pain and swelling in his left great toe MTP joint.  A Doppler scan was accomplished, that was normal.  I asked saw him yesterday afternoon, he was afebrile, but the left great toe MTP joint was swollen red and tender, and after discussing options, we aspirated a few drops of blood from the MTP joint it was sent off for Gram stain and culture.  The Gram stain showed a few white cells and no organisms.  We have also obtained an MRI scan that was consistent with inflammation of the distal aspect of the first metatarsal and the proximal phalanx and consistent with a septic pyarthrosis, however, they did not see any discrete effusion.  To recap, the patient had a left great toe cheilectomy on 09/08/2013 and was admitted to the hospital for cellulitis 8 days later.  The MRI scan was most consistent with postoperative changes and he was discharged with oral Keflex, which he took until 29 September 2013.  He was and remains afebrile.  We also ordered a CBC, sedimentation rate and CRP which were drawn this morning.  Past Medical History  Diagnosis Date  . Coronary artery disease   . Hyperlipemia   . Hypertension   . Arthritis   . Seasonal allergies   . Wears hearing aid     both ears  . Wears glasses     reading only  . Myocardial infarction 1998    Past Surgical History  Procedure Laterality Date  . Shoulder arthroscopy  2012    left  . Total knee arthroplasty  2012    rt   . Thumb arthroscopy  2007  . Great toe arthrodesis, interphalangeal joint  2009    rt  . Ankle surgery  2011    lt  . Shoulder arthroscopy  2004    rt, rotator  . Tonsillectomy    . Lumbar laminectomy  2013    repeat for leaking spinal fluid  . Ligament repair  12/11/2011    Procedure: LIGAMENT REPAIR;  Surgeon: Kerin Salen, MD;  Location: [place];  Service:  Orthopedics;  Laterality: Left;  left thumb anchovoy procedure with suspensionplasty  . Coronary artery [place]  [place] [facility]  . Colon surgery      age 26-colon repair  . Bunionectomy Left 09/08/2013    Procedure: LEFT 1ST MTP JOINT CHILECTOMY ;  Surgeon: Kerin Salen, MD;  Location: [place];  Service: Orthopedics;  Laterality: Left;  . Coronary angioplasty  1998    stent lad  . Joint replacement Bilateral     lt total knee  . Cholecystectomy  2003  . Eye surgery Bilateral     caTARACT    No family history on file. Social History:  reports that he has never smoked. He does not have any smokeless tobacco history on file. He reports that he drinks alcohol. He reports that he does not use illicit drugs.  Allergies:  Allergies  Allergen Reactions  . Darvocet [Propoxyphene N-Acetaminophen]     Cannot sleep  . Hydrocodone     keeps him awake  . Oxycodone     Muscle spasms    No prescriptions prior to admission    No results found for this or any previous visit (from the past 48  hour(s)). No results found.  Review of Systems  Constitutional: Negative.   HENT: Negative.   Eyes: Negative.   Cardiovascular: Negative.   Gastrointestinal: Negative.   Genitourinary: Negative.   Musculoskeletal: Positive for joint pain.  Skin: Negative.   Neurological: Negative.   Endo/Heme/Allergies: Negative.   Psychiatric/Behavioral: Negative.     There were no vitals taken for this visit. Physical Exam  Constitutional: He is oriented to person, place, and time. He appears well-developed and well-nourished.  HENT:  Head: Normocephalic and atraumatic.  Eyes: Pupils are equal, round, and reactive to light.  Neck: Normal range of motion. Neck supple.  Cardiovascular: Intact distal pulses.   Respiratory: Effort normal.  Musculoskeletal: He exhibits edema and tenderness.  The left great toe is 1-2+ swollen and erythematous.    Neurological: He is alert and  oriented to person, place, and time.  Skin: Skin is warm and dry.  Psychiatric: He has a normal mood and affect. His behavior is normal. Judgment and thought content normal.     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  Plan: The patient will be kept off antibiotics until we taken for open irrigation and debridement at [place] tomorrow.  We'll obtain infectious disease consultation for IV antibiotics, placed a PICC line and observe him overnight with a small drain.  We went over this in detail and the patient I believe has a good understanding of what is going on and the plan going forward.  All see him back at the time of surgical intervention tomorrow.  PHILLIPS, ERIC R 10/07/2013, 1:52 PM

## 2013-10-07 NOTE — Progress Notes (Signed)
10/07/13 1338  OBSTRUCTIVE SLEEP APNEA  Have you ever been diagnosed with sleep apnea through a sleep study? No  Do you snore loudly (loud enough to be heard through closed doors)?  1  Do you often feel tired, fatigued, or sleepy during the daytime? 0  Has anyone observed you stop breathing during your sleep? 1  Do you have, or are you being treated for high blood pressure? 1  BMI more than 35 kg/m2? 0  Age over 75 years old? 1  Neck circumference greater than 40 cm/16 inches? 0  Gender: 1  Obstructive Sleep Apnea Score 5  Score 4 or greater  Results sent to PCP

## 2013-10-08 ENCOUNTER — Encounter (HOSPITAL_COMMUNITY): Payer: Self-pay | Admitting: Anesthesiology

## 2013-10-08 ENCOUNTER — Ambulatory Visit (HOSPITAL_COMMUNITY): Payer: Medicare Other | Admitting: Anesthesiology

## 2013-10-08 ENCOUNTER — Ambulatory Visit (HOSPITAL_COMMUNITY)
Admission: RE | Admit: 2013-10-08 | Discharge: 2013-10-08 | Disposition: A | Discharge status: Home / Self Care | Payer: Medicare Other | Source: Ambulatory Visit | Attending: Orthopedic Surgery | Admitting: Orthopedic Surgery

## 2013-10-08 ENCOUNTER — Encounter (HOSPITAL_COMMUNITY)
Admission: RE | Disposition: A | Discharge status: Home / Self Care | Payer: Self-pay | Source: Ambulatory Visit | Attending: Orthopedic Surgery

## 2013-10-08 ENCOUNTER — Encounter (HOSPITAL_COMMUNITY): Payer: Medicare Other | Admitting: Anesthesiology

## 2013-10-08 DIAGNOSIS — Z538 Procedure and treatment not carried out for other reasons: Secondary | ICD-10-CM | POA: Insufficient documentation

## 2013-10-08 DIAGNOSIS — M79609 Pain in unspecified limb: Secondary | ICD-10-CM | POA: Insufficient documentation

## 2013-10-08 HISTORY — DX: Cardiac arrhythmia, unspecified: I49.9

## 2013-10-08 HISTORY — DX: Chronic kidney disease, unspecified: N18.9

## 2013-10-08 SURGERY — IRRIGATION AND DEBRIDEMENT EXTREMITY
Anesthesia: General | Laterality: Left

## 2013-10-08 MED ORDER — CHLORHEXIDINE GLUCONATE 4 % EX LIQD: 60.0000 mL | Freq: Once | CUTANEOUS | Status: DC

## 2013-10-08 MED ORDER — PROPOFOL 10 MG/ML IV BOLUS
INTRAVENOUS | Status: AC
  Filled 2013-10-08: qty 20

## 2013-10-08 MED ORDER — MIDAZOLAM HCL 2 MG/2ML IJ SOLN
INTRAMUSCULAR | Status: AC
  Filled 2013-10-08: qty 2

## 2013-10-08 MED ORDER — FENTANYL CITRATE 0.05 MG/ML IJ SOLN
INTRAMUSCULAR | Status: AC
  Filled 2013-10-08: qty 5

## 2013-10-08 SURGICAL SUPPLY — 47 items
BANDAGE ELASTIC 4 VELCRO ST LF (GAUZE/BANDAGES/DRESSINGS) ×3 IMPLANT
BANDAGE ELASTIC 6 VELCRO ST LF (GAUZE/BANDAGES/DRESSINGS) ×3 IMPLANT
BANDAGE GAUZE ELAST BULKY 4 IN (GAUZE/BANDAGES/DRESSINGS) ×3 IMPLANT
BLADE SURG 10 STRL SS (BLADE) ×3 IMPLANT
BNDG COHESIVE 4X5 TAN STRL (GAUZE/BANDAGES/DRESSINGS) ×3 IMPLANT
COVER SURGICAL LIGHT HANDLE (MISCELLANEOUS) ×3 IMPLANT
CUFF TOURNIQUET SINGLE 18IN (TOURNIQUET CUFF) ×3 IMPLANT
CUFF TOURNIQUET SINGLE 24IN (TOURNIQUET CUFF) IMPLANT
CUFF TOURNIQUET SINGLE 34IN LL (TOURNIQUET CUFF) IMPLANT
CUFF TOURNIQUET SINGLE 44IN (TOURNIQUET CUFF) IMPLANT
DURAPREP 26ML APPLICATOR (WOUND CARE) ×3 IMPLANT
ELECT REM PT RETURN 9FT ADLT (ELECTROSURGICAL)
ELECTRODE REM PT RTRN 9FT ADLT (ELECTROSURGICAL) IMPLANT
EVACUATOR 1/8 PVC DRAIN (DRAIN) IMPLANT
FACESHIELD WRAPAROUND (MASK) ×6 IMPLANT
GAUZE XEROFORM 1X8 LF (GAUZE/BANDAGES/DRESSINGS) ×3 IMPLANT
GLOVE BIO SURGEON STRL SZ7.5 (GLOVE) ×3 IMPLANT
GLOVE BIO SURGEON STRL SZ8.5 (GLOVE) ×3 IMPLANT
GLOVE BIOGEL PI IND STRL 8 (GLOVE) ×1 IMPLANT
GLOVE BIOGEL PI IND STRL 9 (GLOVE) ×1 IMPLANT
GLOVE BIOGEL PI INDICATOR 8 (GLOVE) ×2
GLOVE BIOGEL PI INDICATOR 9 (GLOVE) ×2
GOWN STRL REUS W/ TWL LRG LVL3 (GOWN DISPOSABLE) ×3 IMPLANT
GOWN STRL REUS W/ TWL XL LVL3 (GOWN DISPOSABLE) ×1 IMPLANT
GOWN STRL REUS W/TWL LRG LVL3 (GOWN DISPOSABLE) ×6
GOWN STRL REUS W/TWL XL LVL3 (GOWN DISPOSABLE) ×2
HANDPIECE INTERPULSE COAX TIP (DISPOSABLE)
KIT BASIN OR (CUSTOM PROCEDURE TRAY) ×3 IMPLANT
KIT ROOM TURNOVER OR (KITS) ×3 IMPLANT
MANIFOLD NEPTUNE II (INSTRUMENTS) ×3 IMPLANT
NS IRRIG 1000ML POUR BTL (IV SOLUTION) ×3 IMPLANT
PACK ORTHO EXTREMITY (CUSTOM PROCEDURE TRAY) ×3 IMPLANT
PAD ARMBOARD 7.5X6 YLW CONV (MISCELLANEOUS) ×6 IMPLANT
PENCIL BUTTON HOLSTER BLD 10FT (ELECTRODE) IMPLANT
SET HNDPC FAN SPRY TIP SCT (DISPOSABLE) IMPLANT
SPONGE GAUZE 4X4 12PLY (GAUZE/BANDAGES/DRESSINGS) ×3 IMPLANT
SPONGE LAP 18X18 X RAY DECT (DISPOSABLE) ×3 IMPLANT
STOCKINETTE IMPERVIOUS 9X36 MD (GAUZE/BANDAGES/DRESSINGS) ×3 IMPLANT
SUT ETHILON 3 0 PS 1 (SUTURE) IMPLANT
TOWEL OR 17X24 6PK STRL BLUE (TOWEL DISPOSABLE) ×3 IMPLANT
TOWEL OR 17X26 10 PK STRL BLUE (TOWEL DISPOSABLE) ×3 IMPLANT
TUBE ANAEROBIC SPECIMEN COL (MISCELLANEOUS) IMPLANT
TUBE CONNECTING 12'X1/4 (SUCTIONS) ×1
TUBE CONNECTING 12X1/4 (SUCTIONS) ×2 IMPLANT
UNDERPAD 30X30 INCONTINENT (UNDERPADS AND DIAPERS) ×3 IMPLANT
WATER STERILE IRR 1000ML POUR (IV SOLUTION) ×3 IMPLANT
YANKAUER SUCT BULB TIP NO VENT (SUCTIONS) ×3 IMPLANT

## 2013-10-08 NOTE — Anesthesia Preprocedure Evaluation (Deleted)
Anesthesia Evaluation  Patient identified by MRN, date of birth, ID band Patient awake    Reviewed: Allergy & Precautions, H&P , Patient's Chart, lab work & pertinent test results, reviewed documented beta blocker date and time   History of Anesthesia Complications Negative for: history of anesthetic complications  Airway Mallampati: II TM Distance: >3 FB Neck ROM: full    Dental   Pulmonary  breath sounds clear to auscultation        Cardiovascular Exercise Tolerance: Good hypertension, + CAD and + Past MI + dysrhythmias Rhythm:regular Rate:Normal     Neuro/Psych negative psych ROS   GI/Hepatic   Endo/Other    Renal/GU Renal disease     Musculoskeletal   Abdominal   Peds  Hematology   Anesthesia Other Findings   Reproductive/Obstetrics                         Anesthesia Physical Anesthesia Plan  ASA: III  Anesthesia Plan: General LMA   Post-op Pain Management:    Induction:   Airway Management Planned:   Additional Equipment:   Intra-op Plan:   Post-operative Plan:   Informed Consent: I have reviewed the patients History and Physical, chart, labs and discussed the procedure including the risks, benefits and alternatives for the proposed anesthesia with the patient or authorized representative who has indicated his/her understanding and acceptance.   Dental Advisory Given  Plan Discussed with: CRNA, Surgeon and Anesthesiologist  Anesthesia Plan Comments:         Anesthesia Quick Evaluation                                  Anesthesia Evaluation  Patient identified by MRN, date of birth, ID band Patient awake    Reviewed: Allergy & Precautions, H&P , NPO status , Patient's Chart, lab work & pertinent test results, reviewed documented beta blocker date and time   History of Anesthesia Complications Negative for: history of anesthetic complications  Airway Mallampati:  II TM Distance: >3 FB Neck ROM: Full    Dental  (+) Caps, Dental Advisory Given   Pulmonary former smoker,  breath sounds clear to auscultation  Pulmonary exam normal       Cardiovascular hypertension, Pt. on medications and Pt. on home beta blockers - angina+ CAD, + Past MI, + Cardiac Stents and + CABG (2014) + dysrhythmias (coumadin) Atrial Fibrillation Rhythm:Regular Rate:Normal     Neuro/Psych negative neurological ROS     GI/Hepatic negative GI ROS, Neg liver ROS,   Endo/Other  negative endocrine ROS  Renal/GU negative Renal ROS     Musculoskeletal   Abdominal   Peds  Hematology  (+) Blood dyscrasia (coumadin: INR 2.23), ,   Anesthesia Other Findings   Reproductive/Obstetrics                        Anesthesia Physical Anesthesia Plan  ASA: III  Anesthesia Plan: General   Post-op Pain Management:    Induction: Intravenous  Airway Management Planned: LMA  Additional Equipment:   Intra-op Plan:   Post-operative Plan: Extubation in OR  Informed Consent: I have reviewed the patients History and Physical, chart, labs and discussed the procedure including the risks, benefits and alternatives for the proposed anesthesia with the patient or authorized representative who has indicated his/her understanding and acceptance.   Dental advisory given  Plan Discussed with:  CRNA and Surgeon  Anesthesia Plan Comments: (Plan routine monitors, GA- LMA OK)        Anesthesia Quick Evaluation  Case cancelled by Mayer Camel

## 2013-10-25 ENCOUNTER — Encounter (HOSPITAL_COMMUNITY): Payer: Self-pay | Admitting: *Deleted

## 2014-05-05 ENCOUNTER — Encounter (HOSPITAL_COMMUNITY): Payer: Self-pay | Admitting: Orthopedic Surgery

## 2015-05-24 HISTORY — PX: PACEMAKER IMPLANT: EP1218

## 2019-08-23 ENCOUNTER — Other Ambulatory Visit: Payer: Self-pay | Admitting: Orthopedic Surgery

## 2019-08-26 ENCOUNTER — Encounter (HOSPITAL_COMMUNITY): Payer: Self-pay

## 2019-08-26 NOTE — Patient Instructions (Addendum)
DUE TO COVID-19 ONLY ONE VISITOR ARE ALLOWED TO COME WITH YOU AND STAY IN THE WAITING ROOM ONLY DURING PRE OP AND PROCEDURE. THEN TWO VISITORS MAY VISIT WITH YOU IN YOUR PRIVATE ROOM DURING VISITING HOURS ONLY!!   COVID SWAB TESTING MUST BE COMPLETED ON:  Monday, Sep 06, 2019 at [place], [place] -[facility] enter pre surgical testing line (Must self quarantine after testing. Follow instructions on handout.)             Your procedure is scheduled on: Thursday, Sep 09, 2019   Report to [facility] Main  Entrance    Report to admitting at 9:45 AM   Call this number if you have problems the morning of surgery [phone]   Bring CPAP mask and tubing day of surgery   Do not eat food:After Midnight.   May have liquids until 9:15 AM day of surgery   CLEAR LIQUID DIET  Foods Allowed                                                                     Foods Excluded  Water, Black Coffee and tea, regular and decaf                             liquids that you cannot  Plain Jell-O in any flavor  (No red)                                           see through such as: Fruit ices (not with fruit pulp)                                     milk, soups, orange juice  Iced Popsicles (No red)                                    All solid food Carbonated beverages, regular and diet                                    Apple juices Sports drinks like Gatorade (No red) Lightly seasoned clear broth or consume(fat free) Sugar, honey syrup  Sample Menu Breakfast                                Lunch                                     Supper Cranberry juice                    Beef broth  Chicken broth Jell-O                                     Grape juice                           Apple juice Coffee or tea                        Jell-O                                      Popsicle                                                 Coffee or tea                        Coffee or tea   Complete one Ensure drink the morning of surgery at 9:15 AM the day of surgery.   Oral Hygiene is also important to reduce your risk of infection.                                    Remember - BRUSH YOUR TEETH THE MORNING OF SURGERY WITH YOUR REGULAR TOOTHPASTE   Do NOT smoke after Midnight   Take these medicines the morning of surgery with A SIP OF WATER: Tikosyn, Finasteride,     May use eyedrops and nasal spray day of surgery                               You may not have any metal on your body including jewelry, and body piercings             Do not wear lotions, powders, perfumes/cologne, or deodorant                           Men may shave face and neck.   Do not bring valuables to the hospital. [place].   Contacts, dentures or bridgework may not be worn into surgery.   Bring small overnight bag day of surgery.    Patients discharged the day of surgery will not be allowed to drive home.   Special Instructions: Bring a copy of your healthcare power of attorney and living will documents         the day of surgery if you haven't scanned them in before.              Please read over the following fact sheets you were given: IF YOU HAVE QUESTIONS ABOUT YOUR PRE OP INSTRUCTIONS PLEASE CALL [facility]- Preparing for Total Shoulder Arthroplasty    Before surgery, you can play an important role. Because skin is not sterile, your skin needs to be as free of germs as possible. You can reduce the number of  germs on your skin by using the following products. . Benzoyl Peroxide Gel o Reduces the number of germs present on the skin o Applied twice a day to shoulder area starting two days before surgery    ==================================================================  Please follow these instructions carefully:  BENZOYL PEROXIDE 5% GEL  Please do not use if you  have an allergy to benzoyl peroxide.   If your skin becomes reddened/irritated stop using the benzoyl peroxide.  Starting two days before surgery, apply as follows:  (Tuesday and Wednesday) 1. Apply benzoyl peroxide in the morning and at night. Apply after taking a shower. If you are not taking a shower clean entire shoulder front, back, and side along with the armpit with a clean wet washcloth.  2. Place a quarter-sized dollop on your shoulder and rub in thoroughly, making sure to cover the front, back, and side of your shoulder, along with the armpit.   2 days before ____ AM   ____ PM              1 day before ____ AM   ____ PM                         3. Do this twice a day for two days.  (Last application is the night before surgery, AFTER using the CHG soap as described below).  4. Do NOT apply benzoyl peroxide gel on the day of surgery.  [facility] - Preparing for Surgery Before surgery, you can play an important role.  Because skin is not sterile, your skin needs to be as free of germs as possible.  You can reduce the number of germs on your skin by washing with CHG (chlorahexidine gluconate) soap before surgery.  CHG is an antiseptic cleaner which kills germs and bonds with the skin to continue killing germs even after washing. Please DO NOT use if you have an allergy to CHG or antibacterial soaps.  If your skin becomes reddened/irritated stop using the CHG and inform your nurse when you arrive at Short Stay. Do not shave (including legs and underarms) for at least 48 hours prior to the first CHG shower.  You may shave your face/neck.  Please follow these instructions carefully:  1.  Shower with CHG Soap the night before surgery and the  morning of surgery.  2.  If you choose to wash your hair, wash your hair first as usual with your normal  shampoo.  3.  After you shampoo, rinse your hair and body thoroughly to remove the shampoo.                             4.  Use CHG as you would  any other liquid soap.  You can apply chg directly to the skin and wash.  Gently with a scrungie or clean washcloth.  5.  Apply the CHG Soap to your body ONLY FROM THE NECK DOWN.   Do   not use on face/ open                           Wound or open sores. Avoid contact with eyes, ears mouth and   genitals (private parts).                       Wash face,  Genitals (private parts) with your normal  soap.             6.  Wash thoroughly, paying special attention to the area where your    surgery  will be performed.  7.  Thoroughly rinse your body with warm water from the neck down.  8.  DO NOT shower/wash with your normal soap after using and rinsing off the CHG Soap.                9.  Pat yourself dry with a clean towel.            10.  Wear clean pajamas.            11.  Place clean sheets on your bed the night of your first shower and do not  sleep with pets. Day of Surgery : Do not apply any lotions/deodorants the morning of surgery.  Please wear clean clothes to the hospital/surgery center.  FAILURE TO FOLLOW THESE INSTRUCTIONS MAY RESULT IN THE CANCELLATION OF YOUR SURGERY  PATIENT SIGNATURE_________________________________  NURSE SIGNATURE__________________________________  ________________________________________________________________________   Adam Shannon  An incentive spirometer is a tool that can help keep your lungs clear and active. This tool measures how well you are filling your lungs with each breath. Taking long deep breaths may help reverse or decrease the chance of developing breathing (pulmonary) problems (especially infection) following:  A long period of time when you are unable to move or be active. BEFORE THE PROCEDURE   If the spirometer includes an indicator to show your best effort, your nurse or respiratory therapist will set it to a desired goal.  If possible, sit up straight or lean slightly forward. Try not to slouch.  Hold the incentive  spirometer in an upright position. INSTRUCTIONS FOR USE  1. Sit on the edge of your bed if possible, or sit up as far as you can in bed or on a chair. 2. Hold the incentive spirometer in an upright position. 3. Breathe out normally. 4. Place the mouthpiece in your mouth and seal your lips tightly around it. 5. Breathe in slowly and as deeply as possible, raising the piston or the ball toward the top of the column. 6. Hold your breath for 3-5 seconds or for as long as possible. Allow the piston or ball to fall to the bottom of the column. 7. Remove the mouthpiece from your mouth and breathe out normally. 8. Rest for a few seconds and repeat Steps 1 through 7 at least 10 times every 1-2 hours when you are awake. Take your time and take a few normal breaths between deep breaths. 9. The spirometer may include an indicator to show your best effort. Use the indicator as a goal to work toward during each repetition. 10. After each set of 10 deep breaths, practice coughing to be sure your lungs are clear. If you have an incision (the cut made at the time of surgery), support your incision when coughing by placing a pillow or rolled up towels firmly against it. Once you are able to get out of bed, walk around indoors and cough well. You may stop using the incentive spirometer when instructed by your caregiver.  RISKS AND COMPLICATIONS  Take your time so you do not get dizzy or light-headed.  If you are in pain, you may need to take or ask for pain medication before doing incentive spirometry. It is harder to take a deep breath if you are having pain. AFTER USE  Rest and breathe  slowly and easily.  It can be helpful to keep track of a log of your progress. Your caregiver can provide you with a simple table to help with this. If you are using the spirometer at home, follow these instructions: [place] IF:   You are having difficultly using the spirometer.  You have trouble using the  spirometer as often as instructed.  Your pain medication is not giving enough relief while using the spirometer.  You develop fever of 100.5 F (38.1 C) or higher. SEEK IMMEDIATE MEDICAL CARE IF:   You cough up bloody sputum that had not been present before.  You develop fever of 102 F (38.9 C) or greater.  You develop worsening pain at or near the incision site. MAKE SURE YOU:   Understand these instructions.  Will watch your condition.  Will get help right away if you are not doing well or get worse. Document Released: 08/19/2006 Document Revised: 07/01/2011 Document Reviewed: 10/20/2006 ExitCare Patient Information 2014 ExitCare, [place].   ________________________________________________________________________  WHAT IS A BLOOD TRANSFUSION? Blood Transfusion Information  A transfusion is the replacement of blood or some of its parts. Blood is made up of multiple cells which provide different functions.  Red blood cells carry oxygen and are used for blood loss replacement.  White blood cells fight against infection.  Platelets control bleeding.  Plasma helps clot blood.  Other blood products are available for specialized needs, such as hemophilia or other clotting disorders. BEFORE THE TRANSFUSION  Who gives blood for transfusions?   Healthy volunteers who are fully evaluated to make sure their blood is safe. This is blood bank blood. Transfusion therapy is the safest it has ever been in the practice of medicine. Before blood is taken from a donor, a complete history is taken to make sure that person has no history of diseases nor engages in risky social behavior (examples are intravenous drug use or sexual activity with multiple partners). The donor's travel history is screened to minimize risk of transmitting infections, such as malaria. The donated blood is tested for signs of infectious diseases, such as HIV and hepatitis. The blood is then tested to be sure it is  compatible with you in order to minimize the chance of a transfusion reaction. If you or a relative donates blood, this is often done in anticipation of surgery and is not appropriate for emergency situations. It takes many days to process the donated blood. RISKS AND COMPLICATIONS Although transfusion therapy is very safe and saves many lives, the main dangers of transfusion include:   Getting an infectious disease.  Developing a transfusion reaction. This is an allergic reaction to something in the blood you were given. Every precaution is taken to prevent this. The decision to have a blood transfusion has been considered carefully by your caregiver before blood is given. Blood is not given unless the benefits outweigh the risks. AFTER THE TRANSFUSION  Right after receiving a blood transfusion, you will usually feel much better and more energetic. This is especially true if your red blood cells have gotten low (anemic). The transfusion raises the level of the red blood cells which carry oxygen, and this usually causes an energy increase.  The nurse administering the transfusion will monitor you carefully for complications. HOME CARE INSTRUCTIONS  No special instructions are needed after a transfusion. You may find your energy is better. Speak with your caregiver about any limitations on activity for underlying diseases you may have. [place]  IF:   Your condition is not improving after your transfusion.  You develop redness or irritation at the intravenous (IV) site. SEEK IMMEDIATE MEDICAL CARE IF:  Any of the following symptoms occur over the next 12 hours:  Shaking chills.  You have a temperature by mouth above 102 F (38.9 C), not controlled by medicine.  Chest, back, or muscle pain.  People around you feel you are not acting correctly or are confused.  Shortness of breath or difficulty breathing.  Dizziness and fainting.  You get a rash or develop hives.  You have  a decrease in urine output.  Your urine turns a dark color or changes to pink, red, or brown. Any of the following symptoms occur over the next 10 days:  You have a temperature by mouth above 102 F (38.9 C), not controlled by medicine.  Shortness of breath.  Weakness after normal activity.  The white part of the eye turns yellow (jaundice).  You have a decrease in the amount of urine or are urinating less often.  Your urine turns a dark color or changes to pink, red, or brown. Document Released: 04/05/2000 Document Revised: 07/01/2011 Document Reviewed: 11/23/2007 [facility] Patient Information 2014 [place], [place].  _______________________________________________________________________

## 2019-08-27 ENCOUNTER — Other Ambulatory Visit: Payer: Self-pay

## 2019-08-27 ENCOUNTER — Encounter (HOSPITAL_COMMUNITY): Payer: Self-pay

## 2019-08-27 ENCOUNTER — Encounter (HOSPITAL_COMMUNITY)
Admission: RE | Admit: 2019-08-27 | Discharge: 2019-08-27 | Disposition: A | Discharge status: Home / Self Care | Payer: Medicare Other | Source: Ambulatory Visit | Attending: Orthopedic Surgery | Admitting: Orthopedic Surgery

## 2019-08-27 HISTORY — DX: Personal history of other diseases of the nervous system and sense organs: Z86.69

## 2019-08-27 HISTORY — DX: Polyneuropathy, unspecified: G62.9

## 2019-08-27 HISTORY — DX: Unspecified hearing loss, unspecified ear: H91.90

## 2019-08-27 HISTORY — DX: Vitamin D deficiency, unspecified: E55.9

## 2019-08-27 HISTORY — DX: Presence of cardiac pacemaker: Z95.0

## 2019-08-27 HISTORY — DX: Obstructive sleep apnea (adult) (pediatric): G47.33

## 2019-08-27 HISTORY — DX: Personal history of other diseases of urinary system: Z87.448

## 2019-08-27 HISTORY — DX: Diverticulosis of intestine, part unspecified, without perforation or abscess without bleeding: K57.90

## 2019-08-27 HISTORY — DX: Squamous cell carcinoma of skin, unspecified: C44.92

## 2019-08-27 HISTORY — DX: Other intervertebral disc degeneration, lumbar region without mention of lumbar back pain or lower extremity pain: M51.369

## 2019-08-27 HISTORY — DX: Basal cell carcinoma of skin, unspecified: C44.91

## 2019-08-27 HISTORY — DX: Personal history of urinary calculi: Z87.442

## 2019-08-27 HISTORY — DX: Peripheral vascular disease, unspecified: I73.9

## 2019-08-27 HISTORY — DX: Rosacea, unspecified: L71.9

## 2019-08-27 HISTORY — DX: Unspecified osteoarthritis, unspecified site: M19.90

## 2019-08-27 HISTORY — DX: Other specified disorders of veins: I87.8

## 2019-08-27 HISTORY — DX: Age-related osteoporosis without current pathological fracture: M81.0

## 2019-08-27 HISTORY — DX: Nonrheumatic mitral (valve) insufficiency: I34.0

## 2019-08-27 HISTORY — DX: Heart failure, unspecified: I50.9

## 2019-08-27 HISTORY — DX: Abdominal aortic ectasia: I77.811

## 2019-08-27 HISTORY — DX: Unspecified atrial fibrillation: I48.91

## 2019-08-27 HISTORY — DX: Other intervertebral disc degeneration, lumbar region: M51.36

## 2019-08-27 HISTORY — DX: Other complications of anesthesia, initial encounter: T88.59XA

## 2019-08-27 NOTE — Progress Notes (Signed)
PCP - Dr. Conley Simmonds Cardiologist - Dr. Jerilynn Mages. [place]/Dr. T. May   Chest x-ray - 09/06/19 in epic EKG - 2/2/21care everywhere Stress Test - 10/21/2018 care everywhere ECHO - 05/25/19 care everywhere Cardiac Cath - 11/13/18 care everywhere  Sleep Study - Yes CPAP - Does not use  Fasting Blood Sugar - N/A Checks Blood Sugar __ N/A___ times a day  Blood Thinner Instructions:  Warfarin will stop 5 days prior to surgery Aspirin Instructions: Yes Last Dose:Will continue taking  Anesthesia review: MI, CABG, CAD, PACEMAKER, CKD, HTN  Patient denies shortness of breath, fever, cough and chest pain at PAT appointment   Patient verbalized understanding of instructions that were given to them at the PAT appointment. Patient was also instructed that they will need to review over the PAT instructions again at home before surgery.

## 2019-09-06 ENCOUNTER — Other Ambulatory Visit (HOSPITAL_COMMUNITY)
Admission: RE | Admit: 2019-09-06 | Discharge: 2019-09-06 | Disposition: A | Discharge status: Home / Self Care | Payer: Medicare Other | Source: Ambulatory Visit | Attending: Orthopedic Surgery | Admitting: Orthopedic Surgery

## 2019-09-06 ENCOUNTER — Other Ambulatory Visit: Payer: Self-pay

## 2019-09-06 ENCOUNTER — Ambulatory Visit (HOSPITAL_COMMUNITY)
Admission: RE | Admit: 2019-09-06 | Discharge: 2019-09-06 | Disposition: A | Discharge status: Home / Self Care | Payer: Medicare Other | Source: Ambulatory Visit | Attending: Orthopedic Surgery | Admitting: Orthopedic Surgery

## 2019-09-06 ENCOUNTER — Encounter (HOSPITAL_COMMUNITY)
Admission: RE | Admit: 2019-09-06 | Discharge: 2019-09-06 | Disposition: A | Discharge status: Home / Self Care | Payer: Medicare Other | Source: Ambulatory Visit | Attending: Orthopedic Surgery | Admitting: Orthopedic Surgery

## 2019-09-06 DIAGNOSIS — I700 Atherosclerosis of aorta: Secondary | ICD-10-CM | POA: Insufficient documentation

## 2019-09-06 DIAGNOSIS — N183 Chronic kidney disease, stage 3 unspecified: Secondary | ICD-10-CM | POA: Diagnosis not present

## 2019-09-06 DIAGNOSIS — Z79899 Other long term (current) drug therapy: Secondary | ICD-10-CM | POA: Diagnosis not present

## 2019-09-06 DIAGNOSIS — Z20822 Contact with and (suspected) exposure to covid-19: Secondary | ICD-10-CM | POA: Insufficient documentation

## 2019-09-06 DIAGNOSIS — I4892 Unspecified atrial flutter: Secondary | ICD-10-CM | POA: Insufficient documentation

## 2019-09-06 DIAGNOSIS — I509 Heart failure, unspecified: Secondary | ICD-10-CM | POA: Insufficient documentation

## 2019-09-06 DIAGNOSIS — M75102 Unspecified rotator cuff tear or rupture of left shoulder, not specified as traumatic: Secondary | ICD-10-CM | POA: Insufficient documentation

## 2019-09-06 DIAGNOSIS — I130 Hypertensive heart and chronic kidney disease with heart failure and stage 1 through stage 4 chronic kidney disease, or unspecified chronic kidney disease: Secondary | ICD-10-CM | POA: Insufficient documentation

## 2019-09-06 DIAGNOSIS — Z7901 Long term (current) use of anticoagulants: Secondary | ICD-10-CM | POA: Insufficient documentation

## 2019-09-06 DIAGNOSIS — Z01811 Encounter for preprocedural respiratory examination: Secondary | ICD-10-CM

## 2019-09-06 DIAGNOSIS — Z01818 Encounter for other preprocedural examination: Secondary | ICD-10-CM | POA: Diagnosis present

## 2019-09-06 DIAGNOSIS — I4510 Unspecified right bundle-branch block: Secondary | ICD-10-CM | POA: Insufficient documentation

## 2019-09-06 DIAGNOSIS — Z7982 Long term (current) use of aspirin: Secondary | ICD-10-CM | POA: Diagnosis not present

## 2019-09-06 DIAGNOSIS — Z950 Presence of cardiac pacemaker: Secondary | ICD-10-CM | POA: Insufficient documentation

## 2019-09-06 LAB — CBC WITH DIFFERENTIAL/PLATELET
Abs Immature Granulocytes: 0.01 10*3/uL (ref 0.00–0.07)
Basophils Absolute: 0.1 10*3/uL (ref 0.0–0.1)
Basophils Relative: 1 %
Eosinophils Absolute: 0.1 10*3/uL (ref 0.0–0.5)
Eosinophils Relative: 2 %
HCT: 34.7 % — ABNORMAL LOW (ref 39.0–52.0)
Hemoglobin: 11 g/dL — ABNORMAL LOW (ref 13.0–17.0)
Immature Granulocytes: 0 %
Lymphocytes Relative: 19 %
Lymphs Abs: 0.8 10*3/uL (ref 0.7–4.0)
MCH: 35.1 pg — ABNORMAL HIGH (ref 26.0–34.0)
MCHC: 31.7 g/dL (ref 30.0–36.0)
MCV: 110.9 fL — ABNORMAL HIGH (ref 80.0–100.0)
Monocytes Absolute: 0.6 10*3/uL (ref 0.1–1.0)
Monocytes Relative: 13 %
Neutro Abs: 2.8 10*3/uL (ref 1.7–7.7)
Neutrophils Relative %: 65 %
Platelets: 102 10*3/uL — ABNORMAL LOW (ref 150–400)
RBC: 3.13 MIL/uL — ABNORMAL LOW (ref 4.22–5.81)
RDW: 13.5 % (ref 11.5–15.5)
WBC: 4.4 10*3/uL (ref 4.0–10.5)
nRBC: 0 % (ref 0.0–0.2)

## 2019-09-06 LAB — COMPREHENSIVE METABOLIC PANEL
ALT: 20 U/L (ref 0–44)
AST: 44 U/L — ABNORMAL HIGH (ref 15–41)
Albumin: 4.6 g/dL (ref 3.5–5.0)
Alkaline Phosphatase: 44 U/L (ref 38–126)
Anion gap: 9 (ref 5–15)
BUN: 25 mg/dL — ABNORMAL HIGH (ref 8–23)
CO2: 21 mmol/L — ABNORMAL LOW (ref 22–32)
Calcium: 11.5 mg/dL — ABNORMAL HIGH (ref 8.9–10.3)
Chloride: 109 mmol/L (ref 98–111)
Creatinine, Ser: 2.08 mg/dL — ABNORMAL HIGH (ref 0.61–1.24)
GFR calc Af Amer: 34 mL/min — ABNORMAL LOW (ref >60)
GFR calc non Af Amer: 29 mL/min — ABNORMAL LOW (ref >60)
Glucose, Bld: 103 mg/dL — ABNORMAL HIGH (ref 70–99)
Potassium: 4.8 mmol/L (ref 3.5–5.1)
Sodium: 139 mmol/L (ref 135–145)
Total Bilirubin: 0.9 mg/dL (ref 0.3–1.2)
Total Protein: 7.2 g/dL (ref 6.5–8.1)

## 2019-09-06 LAB — URINALYSIS, ROUTINE W REFLEX MICROSCOPIC
Bilirubin Urine: NEGATIVE
Glucose, UA: NEGATIVE mg/dL
Ketones, ur: NEGATIVE mg/dL
Leukocytes,Ua: NEGATIVE
Nitrite: NEGATIVE
Protein, ur: 100 mg/dL — AB
Specific Gravity, Urine: 1.019 (ref 1.005–1.030)
pH: 5 (ref 5.0–8.0)

## 2019-09-06 LAB — PROTIME-INR
INR: 2.5 — ABNORMAL HIGH (ref 0.8–1.2)
Prothrombin Time: 26.4 seconds — ABNORMAL HIGH (ref 11.4–15.2)

## 2019-09-06 LAB — ABO/RH: ABO/RH(D): O NEG

## 2019-09-06 LAB — APTT: aPTT: 35 seconds (ref 24–36)

## 2019-09-06 LAB — SURGICAL PCR SCREEN
MRSA, PCR: NEGATIVE
Staphylococcus aureus: NEGATIVE

## 2019-09-06 LAB — SARS CORONAVIRUS 2 (TAT 6-24 HRS): SARS Coronavirus 2: NEGATIVE

## 2019-09-06 NOTE — Progress Notes (Signed)
CBC/DIFF and CMP results 09/06/19 faxed to Dr. Tamera Punt and Janett Billow Zanetto P.A.

## 2019-09-07 ENCOUNTER — Other Ambulatory Visit: Payer: Self-pay | Admitting: Orthopedic Surgery

## 2019-09-07 NOTE — Progress Notes (Signed)
Dr. Jettie Booze surgical clearance received 09/07/19 place in chart. Last office visit note on 06/22/19  from Dr. Jettie Booze received and placed in  Chart.

## 2019-09-07 NOTE — Anesthesia Preprocedure Evaluation (Addendum)
Anesthesia Evaluation  Patient identified by MRN, date of birth, ID band Patient awake    Reviewed: Allergy & Precautions, NPO status , Patient's Chart, lab work & pertinent test results  Airway Mallampati: II  TM Distance: >3 FB Neck ROM: Full    Dental no notable dental hx. (+) Teeth Intact   Pulmonary sleep apnea , COPD,  COPD inhaler, former smoker,    Pulmonary exam normal breath sounds clear to auscultation       Cardiovascular hypertension, Pt. on medications + CAD and + Peripheral Vascular Disease  negative cardio ROS Normal cardiovascular exam+ dysrhythmias Atrial Fibrillation + pacemaker  Rhythm:Regular Rate:Normal  Echo 05/25/2019 (on Care Everywhere) Summary:  1. Overall left ventricular ejection fraction is estimated at 55 to 60%.  2. Normal global left ventricular systolic function.  3. Severely dilated left atrium.  4. Moderate mitral regurgitation. MR radius 0.8cm, EROA 0.18cm2, regurgitant volume 58mL. Likely posterior leaflet restriction.  5. Mild aortic regurgitation.  6. Mild to moderate pulmonic valve regurgitation.   Neuro/Psych negative neurological ROS  negative psych ROS   GI/Hepatic negative GI ROS, Neg liver ROS,   Endo/Other  negative endocrine ROS  Renal/GU CRFRenal diseaseK+ 4.8 Cr 2.08     Musculoskeletal  (+) Arthritis ,   Abdominal   Peds  Hematology negative hematology ROS (+) Hgb11.0 INR 2.5   Anesthesia Other Findings   Reproductive/Obstetrics                       Anesthesia Physical Anesthesia Plan  ASA: IV  Anesthesia Plan: General   Post-op Pain Management:    Induction: Intravenous  PONV Risk Score and Plan: 3 and Treatment may vary due to age or medical condition, Ondansetron and Dexamethasone  Airway Management Planned: Oral ETT  Additional Equipment: None  Intra-op Plan:   Post-operative Plan: Extubation in OR  Informed Consent: I  have reviewed the patients History and Physical, chart, labs and discussed the procedure including the risks, benefits and alternatives for the proposed anesthesia with the patient or authorized representative who has indicated his/her understanding and acceptance.     Dental advisory given  Plan Discussed with:   Anesthesia Plan Comments: (See PAT note 09/06/2019, Konrad Felix, PA-C  Re check INR GA w L ISB)      Anesthesia Quick Evaluation

## 2019-09-07 NOTE — Progress Notes (Signed)
Anesthesia Chart Review   Case: [id number] Date/Time: 09/09/19 1202   Procedure: REVERSE TOTAL SHOULDER ARTHROPLASTY (Left Shoulder)   Anesthesia type: Choice   Pre-op diagnosis: LEFT SHOULDER ROTATOR CUFF TEAR ARTHROPATHY   Location: [place] / [facility]   Surgeons: Tania Ade, MD      DISCUSSION:78 y.o. former smoker (quit 04/22/74) with h/o HLD, HTN, A-flutter/A-fib (on Tikosyn and Coumadin), systolic heart failure s/p BiV PPM (device orders on chart), CAD (CABG x4 2008), OSA, moderate to severe MR, CKD Stage III, left shoulder rotator cuff tear scheduled for above procedure 09/09/2019 with Dr. Tania Ade.   Per Dr. Jettie Booze pt to hold Coumadin 5 days prior to procedure, no need for Lovenox bridge.  Cardiac clearance on chart which states pt is low risk for planned procedure.   Pt last seen by cardiology 06/22/2019.  Per OV note cardiac status stable, chronic shortness of breath which is stable and unchanged over the last 6 months.   Creatinine 2.08 at PAT visit 09/06/2019.  Creatinine between 1.86-2.46 over the last 6 months.  Followed by PCP.   Discussed with Dr. Kalman Shan.  Anticipate pt can proceed with planned procedure barring acute status change.   VS: BP 133/84   Pulse 100   Temp 36.6 C (Oral)   Resp 16   Ht 5\' 9"  (1.753 m)   Wt 73.5 kg   SpO2 100%   BMI 23.92 kg/m   PROVIDERS: Dolores Patty, MD is PCP last seen 07/22/2019  [place], MD is Cardiologist with [facility] LABS: Labs reviewed: Acceptable for surgery. (all labs ordered are listed, but only abnormal results are displayed)  Labs Reviewed  CBC WITH DIFFERENTIAL/PLATELET - Abnormal; Notable for the following components:      Result Value   RBC 3.13 (*)    Hemoglobin 11.0 (*)    HCT 34.7 (*)    MCV 110.9 (*)    MCH 35.1 (*)    Platelets 102 (*)    All other components within normal limits  COMPREHENSIVE METABOLIC PANEL - Abnormal; Notable for the following components:   CO2 21 (*)     Glucose, Bld 103 (*)    BUN 25 (*)    Creatinine, Ser 2.08 (*)    Calcium 11.5 (*)    AST 44 (*)    GFR calc non Af Amer 29 (*)    GFR calc Af Amer 34 (*)    All other components within normal limits  PROTIME-INR - Abnormal; Notable for the following components:   Prothrombin Time 26.4 (*)    INR 2.5 (*)    All other components within normal limits  URINALYSIS, ROUTINE W REFLEX MICROSCOPIC - Abnormal; Notable for the following components:   APPearance CLOUDY (*)    Hgb urine dipstick SMALL (*)    Protein, ur 100 (*)    Bacteria, UA RARE (*)    All other components within normal limits  SURGICAL PCR SCREEN  APTT  TYPE AND SCREEN  ABO/RH     IMAGES:   EKG:   CV: Echo 05/25/2019 (on Care Everywhere) Summary:  1. Overall left ventricular ejection fraction is estimated at 55 to 60%.  2. Normal global left ventricular systolic function.  3. Severely dilated left atrium.  4. Moderate mitral regurgitation. MR radius 0.8cm, EROA 0.18cm2, regurgitant volume 76mL. Likely posterior leaflet restriction.  5. Mild aortic regurgitation.  6. Mild to moderate pulmonic valve regurgitation.  7. Lead seen in the right heart.  8. The TEE probe was passed with difficulty. Past Medical History:  Diagnosis Date  . 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 Atrial fibrillation ([place])   . Basal cell carcinoma   . CHF (congestive heart failure) ([place])   . Chronic kidney disease 10/05/2018   stage 3  . Complication of anesthesia    Hallucinations  . Coronary artery disease   . DDD (degenerative disc disease), lumbar   . Diverticulosis   . Dysrhythmia    a-fib - HX  . Hearing loss   . History of carpal tunnel syndrome   . History of hematuria   . History of kidney stones   . Hyperlipemia   . Hypertension   . Mitral regurgitation   . Myocardial infarction ([place]) 1998  . Neuropathy   . OA (osteoarthritis)   . OSA (obstructive sleep apnea)   . Osteoporosis   . Pacemaker   . Peripheral  vascular disease ([place])   . Rosacea   . Seasonal allergies   . Squamous cell carcinoma of skin   . Venous stasis   . Vitamin D deficiency   . Wears glasses    reading only  . Wears hearing aid    both ears    Past Surgical History:  Procedure Laterality Date  . ANKLE SURGERY  2011   lt  . ANTERIOR CRUCIATE LIGAMENT REPAIR Left 1958  . BILATERAL CARPAL TUNNEL RELEASE    . BUNIONECTOMY Left 09/08/2013   Procedure: LEFT 1ST MTP JOINT CHILECTOMY ;  Surgeon: Kerin Salen, MD;  Location: [facility];  Service: Orthopedics;  Laterality: Left;  . CHOLECYSTECTOMY  2003  . COLON SURGERY     age 66-colon repair  . CORONARY ANGIOPLASTY  1998   stent lad  . CORONARY ARTERY BYPASS GRAFT  9/14   [place]  . EYE SURGERY Bilateral    caTARACT  . GREAT TOE ARTHRODESIS, INTERPHALANGEAL JOINT  2009   rt  . JOINT REPLACEMENT Bilateral    lt total knee  . KNEE ARTHROSCOPY Left    x3  . LIGAMENT REPAIR  12/11/2011   Procedure: LIGAMENT REPAIR;  Surgeon: Kerin Salen, MD;  Location: [facility];  Service: Orthopedics;  Laterality: Left;  left thumb anchovoy procedure with suspensionplasty  . LUMBAR LAMINECTOMY  2013   repeat for leaking spinal fluidx4  . PACEMAKER IMPLANT  05/2015  . SHOULDER ARTHROSCOPY  2012   left  . SHOULDER ARTHROSCOPY  2004   rt, rotator  . THUMB ARTHROSCOPY  2007  . TONSILLECTOMY    . TOTAL KNEE ARTHROPLASTY  2012   rt     MEDICATIONS: . aspirin EC 81 MG tablet  . Cholecalciferol (VITAMIN D3 PO)  . dofetilide (TIKOSYN) 125 MCG capsule  . finasteride (PROSCAR) 5 MG tablet  . furosemide (LASIX) 40 MG tablet  . ipratropium (ATROVENT) 0.06 % nasal spray  . magnesium hydroxide (MILK OF MAGNESIA) 400 MG/5ML suspension  . minocycline (MINOCIN) 100 MG capsule  . nitroGLYCERIN (NITROSTAT) 0.4 MG SL tablet  . polyethylene glycol (MIRALAX / GLYCOLAX) 17 g packet  . potassium chloride (KLOR-CON) 10 MEQ tablet  . rosuvastatin (CRESTOR) 40  MG tablet  . timolol (TIMOPTIC) 0.5 % ophthalmic solution  . vitamin B-12 (CYANOCOBALAMIN) 1000 MCG tablet  . warfarin (COUMADIN) 3 MG tablet   No current facility-administered medications for this encounter.     Maia Plan [facility] Pre-Surgical Testing [phone] 09/07/19  2:29 PM

## 2019-09-09 ENCOUNTER — Ambulatory Visit (HOSPITAL_COMMUNITY): Payer: Medicare Other | Admitting: Physician Assistant

## 2019-09-09 ENCOUNTER — Observation Stay (HOSPITAL_COMMUNITY): Payer: Medicare Other

## 2019-09-09 ENCOUNTER — Observation Stay (HOSPITAL_COMMUNITY)
Admission: RE | Admit: 2019-09-09 | Discharge: 2019-09-10 | Disposition: A | Discharge status: Home / Self Care | Payer: Medicare Other | Attending: Orthopedic Surgery | Admitting: Orthopedic Surgery

## 2019-09-09 ENCOUNTER — Encounter (HOSPITAL_COMMUNITY)
Admission: RE | Disposition: A | Discharge status: Home / Self Care | Payer: Self-pay | Source: Home / Self Care | Attending: Orthopedic Surgery

## 2019-09-09 ENCOUNTER — Ambulatory Visit (HOSPITAL_COMMUNITY): Payer: Medicare Other | Admitting: Certified Registered Nurse Anesthetist

## 2019-09-09 ENCOUNTER — Encounter (HOSPITAL_COMMUNITY): Payer: Self-pay | Admitting: Orthopedic Surgery

## 2019-09-09 ENCOUNTER — Other Ambulatory Visit: Payer: Self-pay

## 2019-09-09 DIAGNOSIS — Z96612 Presence of left artificial shoulder joint: Secondary | ICD-10-CM | POA: Diagnosis present

## 2019-09-09 DIAGNOSIS — G4733 Obstructive sleep apnea (adult) (pediatric): Secondary | ICD-10-CM | POA: Insufficient documentation

## 2019-09-09 DIAGNOSIS — Z951 Presence of aortocoronary bypass graft: Secondary | ICD-10-CM | POA: Diagnosis not present

## 2019-09-09 DIAGNOSIS — M810 Age-related osteoporosis without current pathological fracture: Secondary | ICD-10-CM | POA: Diagnosis not present

## 2019-09-09 DIAGNOSIS — I2510 Atherosclerotic heart disease of native coronary artery without angina pectoris: Secondary | ICD-10-CM | POA: Diagnosis not present

## 2019-09-09 DIAGNOSIS — I130 Hypertensive heart and chronic kidney disease with heart failure and stage 1 through stage 4 chronic kidney disease, or unspecified chronic kidney disease: Secondary | ICD-10-CM | POA: Insufficient documentation

## 2019-09-09 DIAGNOSIS — Z87891 Personal history of nicotine dependence: Secondary | ICD-10-CM | POA: Diagnosis not present

## 2019-09-09 DIAGNOSIS — I088 Other rheumatic multiple valve diseases: Secondary | ICD-10-CM | POA: Diagnosis not present

## 2019-09-09 DIAGNOSIS — M19012 Primary osteoarthritis, left shoulder: Principal | ICD-10-CM | POA: Insufficient documentation

## 2019-09-09 DIAGNOSIS — I509 Heart failure, unspecified: Secondary | ICD-10-CM | POA: Insufficient documentation

## 2019-09-09 DIAGNOSIS — I4891 Unspecified atrial fibrillation: Secondary | ICD-10-CM | POA: Insufficient documentation

## 2019-09-09 DIAGNOSIS — Z950 Presence of cardiac pacemaker: Secondary | ICD-10-CM | POA: Diagnosis not present

## 2019-09-09 DIAGNOSIS — E559 Vitamin D deficiency, unspecified: Secondary | ICD-10-CM | POA: Diagnosis not present

## 2019-09-09 DIAGNOSIS — I739 Peripheral vascular disease, unspecified: Secondary | ICD-10-CM | POA: Diagnosis not present

## 2019-09-09 DIAGNOSIS — Z79899 Other long term (current) drug therapy: Secondary | ICD-10-CM | POA: Insufficient documentation

## 2019-09-09 DIAGNOSIS — Z7901 Long term (current) use of anticoagulants: Secondary | ICD-10-CM | POA: Insufficient documentation

## 2019-09-09 DIAGNOSIS — M75102 Unspecified rotator cuff tear or rupture of left shoulder, not specified as traumatic: Secondary | ICD-10-CM | POA: Diagnosis not present

## 2019-09-09 DIAGNOSIS — N189 Chronic kidney disease, unspecified: Secondary | ICD-10-CM | POA: Insufficient documentation

## 2019-09-09 DIAGNOSIS — I252 Old myocardial infarction: Secondary | ICD-10-CM | POA: Insufficient documentation

## 2019-09-09 DIAGNOSIS — Z85828 Personal history of other malignant neoplasm of skin: Secondary | ICD-10-CM | POA: Insufficient documentation

## 2019-09-09 DIAGNOSIS — E785 Hyperlipidemia, unspecified: Secondary | ICD-10-CM | POA: Insufficient documentation

## 2019-09-09 DIAGNOSIS — K5790 Diverticulosis of intestine, part unspecified, without perforation or abscess without bleeding: Secondary | ICD-10-CM | POA: Diagnosis not present

## 2019-09-09 DIAGNOSIS — Z7982 Long term (current) use of aspirin: Secondary | ICD-10-CM | POA: Insufficient documentation

## 2019-09-09 HISTORY — PX: TOTAL SHOULDER ARTHROPLASTY: SHX126

## 2019-09-09 LAB — TYPE AND SCREEN
ABO/RH(D): O NEG
Antibody Screen: NEGATIVE

## 2019-09-09 LAB — PROTIME-INR
INR: 1.6 — ABNORMAL HIGH (ref 0.8–1.2)
Prothrombin Time: 18.6 seconds — ABNORMAL HIGH (ref 11.4–15.2)

## 2019-09-09 SURGERY — ARTHROPLASTY, SHOULDER, TOTAL
Anesthesia: General | Site: Shoulder | Laterality: Left

## 2019-09-09 MED ORDER — LACTATED RINGERS IV SOLN: INTRAVENOUS | Status: DC | PRN

## 2019-09-09 MED ORDER — DOFETILIDE 125 MCG PO CAPS
125.0000 ug | ORAL_CAPSULE | Freq: Two times a day (BID) | ORAL | Status: DC
  Administered 2019-09-09 – 2019-09-10 (×2): 125 ug via ORAL
  Filled 2019-09-09 (×2): qty 1

## 2019-09-09 MED ORDER — FENTANYL CITRATE (PF) 100 MCG/2ML IJ SOLN: 25.0000 ug | INTRAMUSCULAR | Status: DC | PRN

## 2019-09-09 MED ORDER — FINASTERIDE 5 MG PO TABS
5.0000 mg | ORAL_TABLET | Freq: Every day | ORAL | Status: DC
  Administered 2019-09-10: 5 mg via ORAL
  Filled 2019-09-09: qty 1

## 2019-09-09 MED ORDER — MENTHOL 3 MG MT LOZG: 1.0000 | LOZENGE | OROMUCOSAL | Status: DC | PRN

## 2019-09-09 MED ORDER — DIPHENHYDRAMINE HCL 12.5 MG/5ML PO ELIX: 12.5000 mg | ORAL_SOLUTION | ORAL | Status: DC | PRN

## 2019-09-09 MED ORDER — ONDANSETRON HCL 4 MG/2ML IJ SOLN: 4.0000 mg | Freq: Four times a day (QID) | INTRAMUSCULAR | Status: DC | PRN

## 2019-09-09 MED ORDER — FENTANYL CITRATE (PF) 250 MCG/5ML IJ SOLN
INTRAMUSCULAR | Status: DC | PRN
  Administered 2019-09-09 (×3): 50 ug via INTRAVENOUS

## 2019-09-09 MED ORDER — CHLORHEXIDINE GLUCONATE 0.12 % MT SOLN
15.0000 mL | Freq: Once | OROMUCOSAL | Status: AC
  Administered 2019-09-09: 15 mL via OROMUCOSAL

## 2019-09-09 MED ORDER — PHENOL 1.4 % MT LIQD
1.0000 | OROMUCOSAL | Status: DC | PRN
  Filled 2019-09-09: qty 177

## 2019-09-09 MED ORDER — PROPOFOL 10 MG/ML IV BOLUS
INTRAVENOUS | Status: DC | PRN
  Administered 2019-09-09: 10 mg via INTRAVENOUS
  Administered 2019-09-09: 100 mg via INTRAVENOUS

## 2019-09-09 MED ORDER — ACETAMINOPHEN 10 MG/ML IV SOLN: 1000.0000 mg | Freq: Once | INTRAVENOUS | Status: DC | PRN

## 2019-09-09 MED ORDER — IPRATROPIUM BROMIDE 0.06 % NA SOLN
1.0000 | Freq: Two times a day (BID) | NASAL | Status: DC
  Administered 2019-09-09 – 2019-09-10 (×2): 1 via NASAL
  Filled 2019-09-09: qty 15

## 2019-09-09 MED ORDER — PROPOFOL 10 MG/ML IV BOLUS
INTRAVENOUS | Status: AC
  Filled 2019-09-09: qty 20

## 2019-09-09 MED ORDER — PHENYLEPHRINE HCL-NACL 10-0.9 MG/250ML-% IV SOLN
INTRAVENOUS | Status: DC | PRN
  Administered 2019-09-09: 25 ug/min via INTRAVENOUS

## 2019-09-09 MED ORDER — BUPIVACAINE HCL (PF) 0.5 % IJ SOLN
INTRAMUSCULAR | Status: DC | PRN
  Administered 2019-09-09: 14 mL via PERINEURAL

## 2019-09-09 MED ORDER — FENTANYL CITRATE (PF) 100 MCG/2ML IJ SOLN
50.0000 ug | Freq: Once | INTRAMUSCULAR | Status: AC
  Administered 2019-09-09: 50 ug via INTRAVENOUS
  Filled 2019-09-09: qty 2

## 2019-09-09 MED ORDER — FLEET ENEMA 7-19 GM/118ML RE ENEM: 1.0000 | ENEMA | Freq: Once | RECTAL | Status: DC | PRN

## 2019-09-09 MED ORDER — ORAL CARE MOUTH RINSE: 15.0000 mL | Freq: Once | OROMUCOSAL | Status: AC

## 2019-09-09 MED ORDER — TRANEXAMIC ACID-NACL 1000-0.7 MG/100ML-% IV SOLN
1000.0000 mg | INTRAVENOUS | Status: AC
  Administered 2019-09-09: 1000 mg via INTRAVENOUS
  Filled 2019-09-09: qty 100

## 2019-09-09 MED ORDER — ACETAMINOPHEN 325 MG PO TABS: 325.0000 mg | ORAL_TABLET | Freq: Four times a day (QID) | ORAL | Status: DC | PRN

## 2019-09-09 MED ORDER — ONDANSETRON HCL 4 MG PO TABS: 4.0000 mg | ORAL_TABLET | Freq: Four times a day (QID) | ORAL | Status: DC | PRN

## 2019-09-09 MED ORDER — HYDROCODONE-ACETAMINOPHEN 5-325 MG PO TABS: 1.0000 | ORAL_TABLET | ORAL | Status: DC | PRN

## 2019-09-09 MED ORDER — METOCLOPRAMIDE HCL 5 MG/ML IJ SOLN: 5.0000 mg | Freq: Three times a day (TID) | INTRAMUSCULAR | Status: DC | PRN

## 2019-09-09 MED ORDER — FENTANYL CITRATE (PF) 100 MCG/2ML IJ SOLN
INTRAMUSCULAR | Status: AC
  Filled 2019-09-09: qty 2

## 2019-09-09 MED ORDER — SODIUM CHLORIDE 0.9 % IR SOLN
Status: DC | PRN
  Administered 2019-09-09: 1000 mL

## 2019-09-09 MED ORDER — ZOLPIDEM TARTRATE 5 MG PO TABS: 5.0000 mg | ORAL_TABLET | Freq: Every evening | ORAL | Status: DC | PRN

## 2019-09-09 MED ORDER — SUGAMMADEX SODIUM 500 MG/5ML IV SOLN
INTRAVENOUS | Status: AC
  Filled 2019-09-09: qty 5

## 2019-09-09 MED ORDER — HYDROCODONE-ACETAMINOPHEN 7.5-325 MG PO TABS: 1.0000 | ORAL_TABLET | ORAL | Status: DC | PRN

## 2019-09-09 MED ORDER — SUGAMMADEX SODIUM 500 MG/5ML IV SOLN
INTRAVENOUS | Status: DC | PRN
  Administered 2019-09-09: 300 mg via INTRAVENOUS

## 2019-09-09 MED ORDER — DEXAMETHASONE SODIUM PHOSPHATE 10 MG/ML IJ SOLN
INTRAMUSCULAR | Status: AC
  Filled 2019-09-09: qty 1

## 2019-09-09 MED ORDER — ROCURONIUM BROMIDE 10 MG/ML (PF) SYRINGE
PREFILLED_SYRINGE | INTRAVENOUS | Status: AC
  Filled 2019-09-09: qty 10

## 2019-09-09 MED ORDER — SODIUM CHLORIDE 0.9 % IV SOLN
INTRAVENOUS | Status: DC
  Administered 2019-09-09: 125 mL/h via INTRAVENOUS

## 2019-09-09 MED ORDER — ROSUVASTATIN CALCIUM 20 MG PO TABS
40.0000 mg | ORAL_TABLET | Freq: Every day | ORAL | Status: DC
  Administered 2019-09-09: 40 mg via ORAL
  Filled 2019-09-09: qty 2

## 2019-09-09 MED ORDER — DEXAMETHASONE SODIUM PHOSPHATE 10 MG/ML IJ SOLN
INTRAMUSCULAR | Status: DC | PRN
  Administered 2019-09-09: 4 mg via INTRAVENOUS

## 2019-09-09 MED ORDER — METOCLOPRAMIDE HCL 5 MG PO TABS: 5.0000 mg | ORAL_TABLET | Freq: Three times a day (TID) | ORAL | Status: DC | PRN

## 2019-09-09 MED ORDER — MORPHINE SULFATE (PF) 2 MG/ML IV SOLN: 0.5000 mg | INTRAVENOUS | Status: DC | PRN

## 2019-09-09 MED ORDER — ONDANSETRON HCL 4 MG/2ML IJ SOLN
INTRAMUSCULAR | Status: AC
  Filled 2019-09-09: qty 2

## 2019-09-09 MED ORDER — ACETAMINOPHEN 500 MG PO TABS
500.0000 mg | ORAL_TABLET | Freq: Four times a day (QID) | ORAL | Status: DC
  Administered 2019-09-09 – 2019-09-10 (×3): 500 mg via ORAL
  Filled 2019-09-09 (×3): qty 1

## 2019-09-09 MED ORDER — ASPIRIN EC 81 MG PO TBEC
81.0000 mg | DELAYED_RELEASE_TABLET | Freq: Every day | ORAL | Status: DC
  Administered 2019-09-09 – 2019-09-10 (×2): 81 mg via ORAL
  Filled 2019-09-09 (×2): qty 1

## 2019-09-09 MED ORDER — PHENYLEPHRINE HCL (PRESSORS) 10 MG/ML IV SOLN
INTRAVENOUS | Status: AC
  Filled 2019-09-09: qty 1

## 2019-09-09 MED ORDER — ROCURONIUM BROMIDE 10 MG/ML (PF) SYRINGE
PREFILLED_SYRINGE | INTRAVENOUS | Status: DC | PRN
  Administered 2019-09-09: 50 mg via INTRAVENOUS

## 2019-09-09 MED ORDER — BUPIVACAINE LIPOSOME 1.3 % IJ SUSP
INTRAMUSCULAR | Status: DC | PRN
  Administered 2019-09-09: 10 mL via PERINEURAL

## 2019-09-09 MED ORDER — LACTATED RINGERS IV SOLN: INTRAVENOUS | Status: DC

## 2019-09-09 MED ORDER — LIDOCAINE 2% (20 MG/ML) 5 ML SYRINGE
INTRAMUSCULAR | Status: AC
  Filled 2019-09-09: qty 5

## 2019-09-09 MED ORDER — LIDOCAINE 2% (20 MG/ML) 5 ML SYRINGE
INTRAMUSCULAR | Status: DC | PRN
  Administered 2019-09-09: 80 mg via INTRAVENOUS

## 2019-09-09 MED ORDER — BISACODYL 5 MG PO TBEC: 5.0000 mg | DELAYED_RELEASE_TABLET | Freq: Every day | ORAL | Status: DC | PRN

## 2019-09-09 MED ORDER — DOCUSATE SODIUM 100 MG PO CAPS
100.0000 mg | ORAL_CAPSULE | Freq: Two times a day (BID) | ORAL | Status: DC
  Administered 2019-09-09 – 2019-09-10 (×2): 100 mg via ORAL
  Filled 2019-09-09 (×2): qty 1

## 2019-09-09 MED ORDER — METHOCARBAMOL 500 MG PO TABS: 500.0000 mg | ORAL_TABLET | Freq: Four times a day (QID) | ORAL | Status: DC | PRN

## 2019-09-09 MED ORDER — EPHEDRINE SULFATE-NACL 50-0.9 MG/10ML-% IV SOSY
PREFILLED_SYRINGE | INTRAVENOUS | Status: DC | PRN
  Administered 2019-09-09: 10 mg via INTRAVENOUS

## 2019-09-09 MED ORDER — ONDANSETRON HCL 4 MG/2ML IJ SOLN: 4.0000 mg | Freq: Once | INTRAMUSCULAR | Status: DC | PRN

## 2019-09-09 MED ORDER — 0.9 % SODIUM CHLORIDE (POUR BTL) OPTIME
TOPICAL | Status: DC | PRN
  Administered 2019-09-09: 1000 mL

## 2019-09-09 MED ORDER — METHOCARBAMOL 500 MG IVPB - SIMPLE MED
500.0000 mg | Freq: Four times a day (QID) | INTRAVENOUS | Status: DC | PRN
  Filled 2019-09-09: qty 50

## 2019-09-09 MED ORDER — ONDANSETRON HCL 4 MG/2ML IJ SOLN
INTRAMUSCULAR | Status: DC | PRN
  Administered 2019-09-09: 4 mg via INTRAVENOUS

## 2019-09-09 MED ORDER — MIDAZOLAM HCL 2 MG/2ML IJ SOLN
1.0000 mg | Freq: Once | INTRAMUSCULAR | Status: DC
  Filled 2019-09-09: qty 2

## 2019-09-09 MED ORDER — WATER FOR IRRIGATION, STERILE IR SOLN
Status: DC | PRN
  Administered 2019-09-09: 2000 mL

## 2019-09-09 MED ORDER — EPHEDRINE 5 MG/ML INJ
INTRAVENOUS | Status: AC
  Filled 2019-09-09: qty 10

## 2019-09-09 MED ORDER — ALUM & MAG HYDROXIDE-SIMETH 200-200-20 MG/5ML PO SUSP: 30.0000 mL | ORAL | Status: DC | PRN

## 2019-09-09 MED ORDER — POLYETHYLENE GLYCOL 3350 17 G PO PACK: 17.0000 g | PACK | Freq: Every day | ORAL | Status: DC | PRN

## 2019-09-09 MED ORDER — CEFAZOLIN SODIUM-DEXTROSE 2-4 GM/100ML-% IV SOLN
2.0000 g | INTRAVENOUS | Status: AC
  Administered 2019-09-09: 2 g via INTRAVENOUS
  Filled 2019-09-09: qty 100

## 2019-09-09 MED ORDER — CEFAZOLIN SODIUM-DEXTROSE 1-4 GM/50ML-% IV SOLN
1.0000 g | Freq: Four times a day (QID) | INTRAVENOUS | Status: AC
  Administered 2019-09-09 – 2019-09-10 (×3): 1 g via INTRAVENOUS
  Filled 2019-09-09 (×3): qty 50

## 2019-09-09 SURGICAL SUPPLY — 77 items
BAG ZIPLOCK 12X15 (MISCELLANEOUS) ×3 IMPLANT
BASEPLATE P2 COATD GLND 6.5X30 (Shoulder) ×1 IMPLANT
BIT DRILL 1.6MX128 (BIT) ×2 IMPLANT
BIT DRILL 1.6MX128MM (BIT) ×1
BIT DRILL 2.5 DIA 127 CALI (BIT) ×3 IMPLANT
BIT DRILL 4 DIA CALIBRATED (BIT) ×3 IMPLANT
BLADE SAW SAG 73X25 THK (BLADE) ×2
BLADE SAW SGTL 73X25 THK (BLADE) ×1 IMPLANT
CLOSURE STERI-STRIP 1/2X4 (GAUZE/BANDAGES/DRESSINGS) ×1
CLOSURE WOUND 1/2 X4 (GAUZE/BANDAGES/DRESSINGS) ×1
CLSR STERI-STRIP ANTIMIC 1/2X4 (GAUZE/BANDAGES/DRESSINGS) ×2 IMPLANT
COOLER ICEMAN CLASSIC (MISCELLANEOUS) ×3 IMPLANT
COVER BACK TABLE 60X90IN (DRAPES) ×3 IMPLANT
COVER SURGICAL LIGHT HANDLE (MISCELLANEOUS) ×3 IMPLANT
COVER WAND RF STERILE (DRAPES) ×3 IMPLANT
DRAPE INCISE IOBAN 66X45 STRL (DRAPES) ×3 IMPLANT
DRAPE ORTHO SPLIT 77X108 STRL (DRAPES)
DRAPE POUCH INSTRU U-SHP 10X18 (DRAPES) ×3 IMPLANT
DRAPE SHEET LG 3/4 BI-LAMINATE (DRAPES) ×6 IMPLANT
DRAPE SURG 17X11 SM STRL (DRAPES) ×3 IMPLANT
DRAPE SURG ORHT 6 SPLT 77X108 (DRAPES) IMPLANT
DRAPE U-SHAPE 47X51 STRL (DRAPES) ×3 IMPLANT
DRSG AQUACEL AG ADV 3.5X 6 (GAUZE/BANDAGES/DRESSINGS) ×3 IMPLANT
DRSG PAD ABDOMINAL 8X10 ST (GAUZE/BANDAGES/DRESSINGS) ×6 IMPLANT
DURAPREP 26ML APPLICATOR (WOUND CARE) ×3 IMPLANT
ELECT BLADE TIP CTD 4 INCH (ELECTRODE) ×3 IMPLANT
ELECT REM PT RETURN 15FT ADLT (MISCELLANEOUS) ×3 IMPLANT
GAUZE SPONGE 4X4 12PLY STRL (GAUZE/BANDAGES/DRESSINGS) ×3 IMPLANT
GLOVE BIO SURGEON STRL SZ7 (GLOVE) ×3 IMPLANT
GLOVE BIO SURGEON STRL SZ7.5 (GLOVE) ×3 IMPLANT
GLOVE BIOGEL PI IND STRL 7.0 (GLOVE) ×1 IMPLANT
GLOVE BIOGEL PI IND STRL 8 (GLOVE) ×1 IMPLANT
GLOVE BIOGEL PI INDICATOR 7.0 (GLOVE) ×2
GLOVE BIOGEL PI INDICATOR 8 (GLOVE) ×2
GOWN STRL REUS W/TWL LRG LVL3 (GOWN DISPOSABLE) ×6 IMPLANT
GOWN STRL REUS W/TWL XL LVL3 (GOWN DISPOSABLE) ×3 IMPLANT
HANDPIECE INTERPULSE COAX TIP (DISPOSABLE) ×3
HEMOSTAT SURGICEL 2X14 (HEMOSTASIS) ×3 IMPLANT
HOOD PEEL AWAY FLYTE STAYCOOL (MISCELLANEOUS) ×9 IMPLANT
INSERT EPOLY STND HUMERUS 32MM (Shoulder) ×3 IMPLANT
INSERT EPOLYSTD HUMERUS 32MM (Shoulder) ×1 IMPLANT
KIT BASIN (CUSTOM PROCEDURE TRAY) ×3 IMPLANT
KIT TURNOVER KIT A (KITS) IMPLANT
MANIFOLD NEPTUNE II (INSTRUMENTS) ×3 IMPLANT
NEEDLE TROCAR POINT SZ 2 1/2 (NEEDLE) ×3 IMPLANT
NS IRRIG 1000ML POUR BTL (IV SOLUTION) ×3 IMPLANT
P2 COATDE GLNOID BSEPLT 6.5X30 (Shoulder) ×3 IMPLANT
PACK SHOULDER (CUSTOM PROCEDURE TRAY) ×3 IMPLANT
PAD COLD SHLDR WRAP-ON (PAD) ×3 IMPLANT
PROTECTOR NERVE ULNAR (MISCELLANEOUS) IMPLANT
RESTRAINT HEAD UNIVERSAL NS (MISCELLANEOUS) ×3 IMPLANT
RETRIEVER SUT HEWSON (MISCELLANEOUS) ×3 IMPLANT
SCREW BONE LOCKING RSP 5.0X34 (Screw) ×6 IMPLANT
SCREW BONE RSP LOCK 5X18 (Screw) ×2 IMPLANT
SCREW BONE RSP LOCK 5X34 (Screw) ×2 IMPLANT
SCREW BONE RSP LOCKING 18MM LG (Screw) ×6 IMPLANT
SCREW RETAIN W/HEAD 32MM (Shoulder) ×3 IMPLANT
SET HNDPC FAN SPRY TIP SCT (DISPOSABLE) ×1 IMPLANT
SLING ARM IMMOBILIZER LRG (SOFTGOODS) ×3 IMPLANT
SMARTMIX MINI TOWER (MISCELLANEOUS) ×3
SPONGE LAP 18X18 RF (DISPOSABLE) ×3 IMPLANT
STEM HUMERAL 12X48 STD SHORT (Shoulder) ×3 IMPLANT
STRIP CLOSURE SKIN 1/2X4 (GAUZE/BANDAGES/DRESSINGS) ×2 IMPLANT
SUCTION FRAZIER HANDLE 12FR (TUBING) ×3
SUCTION TUBE FRAZIER 12FR DISP (TUBING) ×1 IMPLANT
SUPPORT WRAP ARM LG (MISCELLANEOUS) ×3 IMPLANT
SUT ETHIBOND 2 V 37 (SUTURE) ×3 IMPLANT
SUT MNCRL AB 4-0 PS2 18 (SUTURE) ×3 IMPLANT
SUT VIC AB 2-0 CT1 27 (SUTURE) ×3
SUT VIC AB 2-0 CT1 TAPERPNT 27 (SUTURE) ×1 IMPLANT
TAPE CLOTH 4X10 WHT NS (GAUZE/BANDAGES/DRESSINGS) ×3 IMPLANT
TAPE LABRALWHITE 1.5X36 (TAPE) ×3 IMPLANT
TAPE SUT LABRALTAP WHT/BLK (SUTURE) ×3 IMPLANT
TOWEL OR 17X26 10 PK STRL BLUE (TOWEL DISPOSABLE) ×3 IMPLANT
TOWER SMARTMIX MINI (MISCELLANEOUS) ×1 IMPLANT
WATER STERILE IRR 1000ML POUR (IV SOLUTION) ×3 IMPLANT
YANKAUER SUCT BULB TIP 10FT TU (MISCELLANEOUS) ×3 IMPLANT

## 2019-09-09 NOTE — Progress Notes (Signed)
AssistedDr. Valma Cava with left, ultrasound guided, interscalene  block. Side rails up, monitors on throughout procedure. See vital signs in flow sheet. Tolerated Procedure well.

## 2019-09-09 NOTE — Op Note (Signed)
Procedure(s): REVERSE TOTAL SHOULDER ARTHROPLASTY Procedure Note  Adam Shannon male 79 y.o. 09/09/2019   Preoperative diagnosis: Left shoulder rotator cuff tear arthropathy  Postoperative diagnosis: Same  Procedure(s) and Anesthesia Type:    LEFT REVERSE TOTAL SHOULDER ARTHROPLASTY - Choice   Indications:  79 y.o. 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 Pain and dysfunction interfered with quality of life and nonoperative treatment with activity modification, NSAIDS and injections failed.     Surgeon: Isabella Stalling   Assistants: Jeanmarie Hubert PA-C [facility] was present and scrubbed throughout the procedure and was essential in positioning, retraction, exposure, and closure)  Anesthesia: General endotracheal anesthesia with preoperative interscalene block given by the attending anesthesiologist    Procedure Detail  REVERSE TOTAL SHOULDER ARTHROPLASTY   Estimated Blood Loss:  200 mL         Drains: none  Blood Given: none          Specimens: none        Complications:  * No complications entered in OR log *         Disposition: PACU - hemodynamically stable.         Condition: stable      OPERATIVE FINDINGS:  A DJO Altivate pressfit reverse total shoulder arthroplasty was placed with a  size 12 stem, a 32 std glenosphere, and a standard poly insert. The base plate  fixation was excellent.  PROCEDURE: The patient was identified in the preoperative holding area  where I personally marked the operative site after verifying site, side,  and procedure with the patient. An interscalene block given by  the attending anesthesiologist in the holding area and the patient was taken back to the operating room where all extremities were  carefully padded in position after general anesthesia was induced. She  was placed in a beach-chair position and the operative upper extremity was  prepped and draped in a standard sterile  fashion. An approximately 10-  cm incision was made from the tip of the coracoid process to the center  point of the humerus at the level of the axilla. Dissection was carried  down through subcutaneous tissues to the level of the cephalic vein  which was taken laterally with the deltoid. The pectoralis major was  retracted medially. The subdeltoid space was developed and the lateral  edge of the conjoined tendon was identified. The undersurface of  conjoined tendon was palpated and the musculocutaneous nerve was not in  the field. Retractor was placed underneath the conjoined and second  retractor was placed lateral into the deltoid. The circumflex humeral  artery and vessels were identified and clamped and coagulated. The  biceps tendon was absent.  The subscapularis was very attenuated and taken down as a peel.  The  joint was then gently externally rotated while the capsule was released  from the humeral neck around to just beyond the 6 o'clock position. At  this point, the joint was dislocated and the humeral head was presented  into the wound. The excessive osteophyte formation was removed with a  large rongeur.  The cutting guide was used to make the appropriate  head cut and the head was saved for potentially bone grafting.  The glenoid was exposed with the arm in an  abducted extended position. The anterior and posterior labrum were  completely excised and the capsule was released circumferentially to  allow for exposure of the glenoid for preparation. The 2.5 mm drill was  placed using the guide in 5-10 inferior angulation and the tap was then advanced in the same hole. Small and large reamers were then used. The tap was then removed and the Metaglene was then screwed in with excellent purchase.  The peripheral guide was then used to drilled measured and filled peripheral locking screws. The size 32 standard glenosphere was then impacted on the [facility] taper and the central screw was  placed. The humerus was then again exposed and the diaphyseal reamers were used followed by the metaphyseal reamers. The final broach was left in place in the proximal trial was placed. The joint was reduced and with this implant it was felt that soft tissue tensioning was appropriate with excellent stability and excellent range of motion. Therefore, final humeral stem was placed press-fit with bone grafting.  And then the trial polyethylene inserts were tested again and the above implant was felt to be the most appropriate for final insertion. The joint was reduced taken through full range of motion and felt to be stable. Soft tissue tension was appropriate.  The joint was then copiously irrigated with pulse  lavage and the wound was then closed. The subscapularis was not repaired.  Skin was closed with 2-0 Vicryl in a deep dermal layer and 4-0  Monocryl for skin closure. Steri-Strips were applied. Sterile  dressings were then applied as well as a sling. The patient was allowed  to awaken from general anesthesia, transferred to stretcher, and taken  to recovery room in stable condition.   POSTOPERATIVE PLAN: The patient will be kept in the hospital postoperatively  for pain control and therapy.

## 2019-09-09 NOTE — Progress Notes (Addendum)
Notified Dr. Valma Cava of cardiology pacemaker orders  that state to provide continuous ECG monitoring when magnet is used or reprogramming is to be performed.  Dr. Valma Cava ok with just placing magnet over patients pacemaker ; [facility] Jude's and notified them of Cardiology's  recomendations aand no further follow up needed and ok to place magnet over pacemaker during procedure .

## 2019-09-09 NOTE — Anesthesia Procedure Notes (Addendum)
Procedure Name: Intubation Date/Time: 09/09/2019 12:28 PM Performed by: Mitzie Na, CRNA Pre-anesthesia Checklist: Patient identified, Emergency Drugs available, Suction available and Patient being monitored Patient Re-evaluated:Patient Re-evaluated prior to induction Oxygen Delivery Method: Circle system utilized Preoxygenation: Pre-oxygenation with 100% oxygen Induction Type: IV induction Ventilation: Mask ventilation without difficulty Laryngoscope Size: Mac and 4 Grade View: Grade II Tube type: Oral Tube size: 7.0 mm Number of attempts: 1 Airway Equipment and Method: Stylet and Oral airway Placement Confirmation: ETT inserted through vocal cords under direct vision,  positive ETCO2 and breath sounds checked- equal and bilateral Secured at: 23 cm Tube secured with: Tape Dental Injury: Teeth and Oropharynx as per pre-operative assessment  Comments: Intubated by Aletha Halim under supervision of MD and CRNA

## 2019-09-09 NOTE — Transfer of Care (Signed)
Immediate Anesthesia Transfer of Care Note  Patient: Adam Shannon  Procedure(s) Performed: REVERSE TOTAL SHOULDER ARTHROPLASTY (Left Shoulder)  Patient Location: PACU  Anesthesia Type:General  Level of Consciousness: drowsy and responds to stimulation  Airway & Oxygen Therapy: Patient Spontanous Breathing and Patient connected to face mask oxygen  Post-op Assessment: Report given to RN and Post -op Vital signs reviewed and stable  Post vital signs: Reviewed and stable  Last Vitals:  Vitals Value Taken Time  BP 119/77 09/09/19 1400  Temp    Pulse 101 09/09/19 1402  Resp 15 09/09/19 1402  SpO2 100 % 09/09/19 1402  Vitals shown include unvalidated device data.  Last Pain:  Vitals:   09/09/19 1114  TempSrc:   PainSc: 0-No pain         Complications: No apparent anesthesia complications

## 2019-09-09 NOTE — Discharge Instructions (Signed)
Discharge Instructions after Reverse Total Shoulder Arthroplasty   . A sling has been provided for you. You are to wear this at all times (except for bathing and dressing), until your first post operative visit with Dr. Chandler. Please also wear while sleeping at night. While you bath and dress, let the arm/elbow extend straight down to stretch your elbow. Wiggle your fingers and pump your first while your in the sling to prevent hand swelling. . Use ice on the shoulder intermittently over the first 48 hours after surgery. Continue to use ice or and ice machine as needed after 48 hours for pain control/swelling.  . Pain medicine has been prescribed for you.  . Use your medicine liberally over the first 48 hours, and then you can begin to taper your use. You may take Extra Strength Tylenol or Tylenol only in place of the pain pills. DO NOT take ANY nonsteroidal anti-inflammatory pain medications: Advil, Motrin, Ibuprofen, Aleve, Naproxen or Naprosyn.  . Take one aspirin a day for 2 weeks after surgery, unless you have an aspirin sensitivity/allergy or asthma.  . Leave your dressing on until your first follow up visit.  You may shower with the dressing.  Hold your arm as if you still have your sling on while you shower. . Simply allow the water to wash over the site and then pat dry. Make sure your axilla (armpit) is completely dry after showering.    Please call [phone] during normal business hours or [phone] after hours for any problems. Including the following:  - excessive redness of the incisions - drainage for more than 4 days - fever of more than 101.5 F  *Please note that pain medications will not be refilled after hours or on weekends.     

## 2019-09-09 NOTE — H&P (Signed)
Adam Shannon is an 79 y.o. male.   Chief Complaint: Left shoulder pain and dysfunction HPI: Endstage left shoulder arthritis with rotator cuff tear arthropathy, significant pain and dysfunction, failed conservative measures.  Pain interferes with sleep and quality of life.   Past Medical History:  Diagnosis Date  . 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 Atrial fibrillation ([place])   . Basal cell carcinoma   . CHF (congestive heart failure) ([place])   . Chronic kidney disease 10/05/2018   stage 3  . Complication of anesthesia    Hallucinations  . Coronary artery disease   . DDD (degenerative disc disease), lumbar   . Diverticulosis   . Dysrhythmia    a-fib - HX  . Hearing loss   . History of carpal tunnel syndrome   . History of hematuria   . History of kidney stones   . Hyperlipemia   . Hypertension   . Mitral regurgitation   . Myocardial infarction ([place]) 1998  . Neuropathy   . OA (osteoarthritis)   . OSA (obstructive sleep apnea)   . Osteoporosis   . Pacemaker   . Peripheral vascular disease ([place])   . Rosacea   . Seasonal allergies   . Squamous cell carcinoma of skin   . Venous stasis   . Vitamin D deficiency   . Wears glasses    reading only  . Wears hearing aid    both ears    Past Surgical History:  Procedure Laterality Date  . ANKLE SURGERY  2011   lt  . ANTERIOR CRUCIATE LIGAMENT REPAIR Left 1958  . BILATERAL CARPAL TUNNEL RELEASE    . BUNIONECTOMY Left 09/08/2013   Procedure: LEFT 1ST MTP JOINT CHILECTOMY ;  Surgeon: Kerin Salen, MD;  Location: [place];  Service: Orthopedics;  Laterality: Left;  . CHOLECYSTECTOMY  2003  . COLON SURGERY     age 55-colon repair  . CORONARY ANGIOPLASTY  1998   stent [place]  . CORONARY ARTERY BYPASS GRAFT  9/14   [place]  . EYE SURGERY Bilateral    caTARACT  . GREAT TOE ARTHRODESIS, INTERPHALANGEAL JOINT  2009   rt  . JOINT REPLACEMENT Bilateral    lt total knee  . KNEE ARTHROSCOPY Left    x3  .  LIGAMENT REPAIR  12/11/2011   Procedure: LIGAMENT REPAIR;  Surgeon: Kerin Salen, MD;  Location: [place];  Service: Orthopedics;  Laterality: Left;  left thumb anchovoy procedure with suspensionplasty  . LUMBAR LAMINECTOMY  2013   repeat for leaking spinal fluidx4  . PACEMAKER IMPLANT  05/2015  . SHOULDER ARTHROSCOPY  2012   left  . SHOULDER ARTHROSCOPY  2004   rt, rotator  . THUMB ARTHROSCOPY  2007  . TONSILLECTOMY    . TOTAL KNEE ARTHROPLASTY  2012   rt     History reviewed. No pertinent family history. Social History:  reports that he quit smoking about 45 years ago. He has never used smokeless tobacco. He reports current alcohol use. He reports that he does not use drugs.  Allergies:  Allergies  Allergen Reactions  . Metoprolol Other (See Comments)    Severe bradycardia  . 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 Baclofen Other (See Comments)    Blood pressure dropped, delirium  . Darvocet [Propoxyphene N-Acetaminophen] Itching and Other (See Comments)    insomnia   . Gabapentin Other (See Comments)    Dizziness loss of balance.  Marland Kitchen  Hydrocodone Itching    (VICODIN) insomnia  . Oxycodone Itching and Other (See Comments)    PERCOCET--muscle twitching  . Tramadol Other (See Comments)    Loopy, lose of balance, urinary retention    Medications Prior to Admission  Medication Sig Dispense Refill  . aspirin EC 81 MG tablet Take 81 mg by mouth daily.    . Cholecalciferol (VITAMIN D3 PO) Take 1 tablet by mouth daily.    Marland Kitchen dofetilide (TIKOSYN) 125 MCG capsule Take 125 mcg by mouth in the morning and at bedtime.    . finasteride (PROSCAR) 5 MG tablet Take 5 mg by mouth daily.    Marland Kitchen ipratropium (ATROVENT) 0.06 % nasal spray Place 1 spray into both nostrils in the morning and at bedtime.    . magnesium hydroxide (MILK OF MAGNESIA) 400 MG/5ML suspension Take 30 mLs by mouth daily as needed for mild constipation.    . minocycline  (MINOCIN) 100 MG capsule Take 100 mg by mouth every other day. In the morning    . nitroGLYCERIN (NITROSTAT) 0.4 MG SL tablet Place 0.4 mg under the tongue every 5 (five) minutes x 3 doses as needed for chest pain.    . rosuvastatin (CRESTOR) 40 MG tablet Take 40 mg by mouth at bedtime.     . timolol (TIMOPTIC) 0.5 % ophthalmic solution Place 1 drop into both eyes daily.    . vitamin B-12 (CYANOCOBALAMIN) 1000 MCG tablet Take 1,000 mcg by mouth daily.    Marland Kitchen warfarin (COUMADIN) 3 MG tablet Take 3-4.5 mg by mouth See admin instructions. Take 1.5 tablets (4.5 mg) by mouth on Mondays in the evening & take 1 tablet (3 mg) by mouth on all other days in the evening.    . furosemide (LASIX) 40 MG tablet Take 40 mg by mouth daily as needed (fluid retention/swelling.).    Marland Kitchen polyethylene glycol (MIRALAX / GLYCOLAX) 17 g packet Take 17 g by mouth daily as needed (constipation.).    Marland Kitchen potassium chloride (KLOR-CON) 10 MEQ tablet Take 10 mEq by mouth daily as needed (with furosemide (fluid retention/swelling)).      No results found for this or any previous visit (from the past 48 hour(s)). No results found.  Review of Systems  All other systems reviewed and are negative.   Blood pressure (!) 135/101, pulse 76, temperature 98.4 F (36.9 C), temperature source Oral, resp. rate 16, weight 73.5 kg, SpO2 100 %. Physical Exam  Constitutional: He is oriented to person, place, and time. He appears well-developed and well-nourished.  HENT:  Head: Atraumatic.  Eyes: EOM are normal.  Cardiovascular: Intact distal pulses.  Respiratory: Effort normal.  Musculoskeletal:     Comments: Left shoulder pain with limited range of motion, distally neurovascularly intact.  Neurological: He is alert and oriented to person, place, and time.  Skin: Skin is warm and dry.  Psychiatric: He has a normal mood and affect.     Assessment/Plan Endstage left shoulder arthritis with rotator cuff tear arthropathy, significant pain  and dysfunction, failed conservative measures.  Pain interferes with sleep and quality of life. Plan left shoulder reverse total shoulder arthroplasty Risks / benefits of surgery discussed Consent on chart  NPO for OR Preop antibiotics     Isabella Stalling, MD 09/09/2019, 11:35 AM

## 2019-09-09 NOTE — Anesthesia Postprocedure Evaluation (Signed)
Anesthesia Post Note  Patient: Adam Shannon  Procedure(s) Performed: REVERSE TOTAL SHOULDER ARTHROPLASTY (Left Shoulder)     Patient location during evaluation: PACU Anesthesia Type: General Level of consciousness: awake and alert Pain management: pain level controlled Vital Signs Assessment: post-procedure vital signs reviewed and stable Respiratory status: spontaneous breathing, nonlabored ventilation, respiratory function stable and patient connected to nasal cannula oxygen Cardiovascular status: blood pressure returned to baseline and stable Postop Assessment: no apparent nausea or vomiting Anesthetic complications: no    Last Vitals:  Vitals:   09/09/19 1445 09/09/19 1500  BP: 127/82 (!) 119/97  Pulse: 87 92  Resp: 12 18  Temp: (!) 36.4 C   SpO2: 100% 100%    Last Pain:  Vitals:   09/09/19 1500  TempSrc:   PainSc: 0-No pain                 Barnet Glasgow

## 2019-09-09 NOTE — Anesthesia Procedure Notes (Addendum)
Anesthesia Regional Block: Interscalene brachial plexus block   Pre-Anesthetic Checklist: ,, timeout performed, Correct Patient, Correct Site, Correct Laterality, Correct Procedure, Correct Position, site marked, Risks and benefits discussed,  Surgical consent,  Pre-op evaluation,  At surgeon's request and post-op pain management  Laterality: Upper and Left  Prep: Maximum Sterile Barrier Precautions used, chloraprep       Needles:  Injection technique: Single-shot  Needle Type: Echogenic Needle     Needle Length: 5cm  Needle Gauge: 21     Additional Needles:   Procedures:,,,, ultrasound used (permanent image in chart),,,,  Narrative:  Start time: 09/09/2019 11:57 AM End time: 09/09/2019 12:04 PM Injection made incrementally with aspirations every 5 mL.  Performed by: Personally  Anesthesiologist: Barnet Glasgow, MD  Additional Notes: Block assessed prior to procedure. Patient tolerated procedure well.

## 2019-09-10 ENCOUNTER — Encounter: Payer: Self-pay | Admitting: *Deleted

## 2019-09-10 DIAGNOSIS — M19012 Primary osteoarthritis, left shoulder: Secondary | ICD-10-CM | POA: Diagnosis not present

## 2019-09-10 LAB — BASIC METABOLIC PANEL
Anion gap: 7 (ref 5–15)
BUN: 26 mg/dL — ABNORMAL HIGH (ref 8–23)
CO2: 21 mmol/L — ABNORMAL LOW (ref 22–32)
Calcium: 10.3 mg/dL (ref 8.9–10.3)
Chloride: 109 mmol/L (ref 98–111)
Creatinine, Ser: 1.91 mg/dL — ABNORMAL HIGH (ref 0.61–1.24)
GFR calc Af Amer: 38 mL/min — ABNORMAL LOW (ref >60)
GFR calc non Af Amer: 32 mL/min — ABNORMAL LOW (ref >60)
Glucose, Bld: 176 mg/dL — ABNORMAL HIGH (ref 70–99)
Potassium: 3.6 mmol/L (ref 3.5–5.1)
Sodium: 137 mmol/L (ref 135–145)

## 2019-09-10 LAB — CBC
HCT: 30.3 % — ABNORMAL LOW (ref 39.0–52.0)
Hemoglobin: 9.7 g/dL — ABNORMAL LOW (ref 13.0–17.0)
MCH: 35.4 pg — ABNORMAL HIGH (ref 26.0–34.0)
MCHC: 32 g/dL (ref 30.0–36.0)
MCV: 110.6 fL — ABNORMAL HIGH (ref 80.0–100.0)
Platelets: 95 10*3/uL — ABNORMAL LOW (ref 150–400)
RBC: 2.74 MIL/uL — ABNORMAL LOW (ref 4.22–5.81)
RDW: 13.2 % (ref 11.5–15.5)
WBC: 6.4 10*3/uL (ref 4.0–10.5)
nRBC: 0 % (ref 0.0–0.2)

## 2019-09-10 NOTE — Evaluation (Signed)
Occupational Therapy Evaluation Patient Details Name: Adam Shannon MRN: [id number] DOB: [birth year] Today's Date: 09/10/2019    History of Present Illness Mr. Adam Shannon s/p reverse shoulder replacement.   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 Patient reports typically using a cane during the day and a RW at night. Therapist provided education and instruction to patient and spouse in regards to exercises, precautions, positioning, donning upper extremity clothing and bathing while maintaining shoulder precautions, ice and edema management and donning/doffing sling. Patient and spouse verbalized understanding and demonstrated as needed. Patient demonstrated difficulty with ambulating initially with two major loss of balances. Different DME utilized during treatment and patient and spouse in agreement to continue to use [facility], with gait belt and wife providing cga assist with home ambulation. Patient did demonstrate ambulation of 80 feet in hallway with [place] after attempting to use RW one handed and quad cane. Patient and spouse feel comfortable and safe with discharge plan. Patient to follow up with MD for further therapy needs.      Follow Up Recommendations  Follow surgeon's recommendation for DC plan and follow-up therapies    Equipment Recommendations  None recommended by OT    Recommendations for Other Services       Precautions / Restrictions Precautions Precautions: Shoulder Shoulder Interventions: Shoulder sling/immobilizer;At all times;Off for dressing/bathing/exercises Precaution Booklet Issued: No Required Braces or Orthoses: Sling Restrictions Weight Bearing Restrictions: Yes LUE Weight Bearing: Non weight bearing      Mobility Bed  Mobility               General bed mobility comments: Min assist for hand hold to transfer to side of bed.  Transfers   Equipment used: Straight cane             General transfer comment: Patient min-mod assist for standing depending on height of surface. Patient ambulated with cane with loss of balance that therapist had to correct x 1 and loss of balance resulting in impromptu return to sitting. Attempted to use rw with one hand and assistance of spouse and quad cane. Patient most comfortable with straight cane - and eventually able to ambulate in hall with cane and min guard from therapist. Patient and spouse report they are comfortable going home with cane.    Balance Overall balance assessment: Needs assistance Sitting-balance support: Feet supported;No upper extremity supported Sitting balance-Leahy Scale: Good     Standing balance support: Single extremity supported Standing balance-Leahy Scale: Poor Standing balance comment: Patient's balance effected by immobilized LUE, peripheral neuropathy, prior chronic knee issue and wearing knee brace.                           ADL either performed or assessed with clinical judgement   ADL Overall ADL's : Needs assistance/impaired Eating/Feeding: Set up   Grooming: Set up;Wash/dry face   Upper Body Bathing: Moderate assistance   Lower Body Bathing: Moderate assistance   Upper Body Dressing : Sitting;Maximal assistance Upper Body Dressing Details (indicate cue type and reason): Max assist to donn button up shirt - limited on Right secondary to IV placement and hospital bands. Lower Body Dressing: Maximal assistance;Sit to/from stand Lower Body Dressing Details (indicate cue type and reason): Patient's spouse assisted patient with  donning underwear, pants and shoes. Toilet Transfer: Min guard;Ambulation   Toileting- Clothing Manipulation and Hygiene: Moderate assistance;Sit to/from stand       Functional  mobility during ADLs: Minimal assistance;Cane General ADL Comments: Patient's spouse able to assist patient with ADLs.     Vision   Vision Assessment?: No apparent visual deficits     Perception     Praxis      Pertinent Vitals/Pain Pain Assessment: No/denies pain     Hand Dominance Right   Extremity/Trunk Assessment Upper Extremity Assessment Upper Extremity Assessment: LUE deficits/detail LUE Deficits / Details: Impaired ROM/strength coordination of LUE secondary to effects of spinal block and shoulder precautions s/p shoulder replacement. LUE: Unable to fully assess due to immobilization           Communication Communication Communication: No difficulties   Cognition Arousal/Alertness: Awake/alert Behavior During Therapy: WFL for tasks assessed/performed Overall Cognitive Status: Within Functional Limits for tasks assessed                                     General Comments       Exercises     Shoulder Instructions Shoulder Instructions Donning/doffing shirt without moving shoulder: Caregiver independent with task Method for sponge bathing under operated UE: Caregiver independent with task Donning/doffing sling/immobilizer: Caregiver independent with task Correct positioning of sling/immobilizer: Caregiver independent with task ROM for elbow, wrist and digits of operated UE: Caregiver independent with task Sling wearing schedule (on at all times/off for ADL's): Caregiver independent with task Proper positioning of operated UE when showering: Caregiver independent with task Dressing change: Caregiver independent with task Positioning of UE while sleeping: Caregiver independent with task    Home Living Family/patient expects to be discharged to:: Private residence Living Arrangements: Spouse/significant other Available Help at Discharge: Family Type of Home: House Home Access: Stairs to enter Technical brewer of Steps: 1   Home  Layout: One level     Bathroom Shower/Tub: Occupational psychologist: Handicapped height Bathroom Accessibility: Yes   Home Equipment: Environmental consultant - 2 wheels;Cane - single point;Shower seat          Prior Functioning/Environment Level of Independence: Independent                 OT Problem List: Impaired balance (sitting and/or standing);Impaired UE functional use      OT Treatment/Interventions:      OT Goals(Current goals can be found in the care plan section) Acute Rehab OT Goals OT Goal Formulation: All assessment and education complete, DC therapy  OT Frequency:     Barriers to D/C:            Co-evaluation              AM-PAC OT "6 Clicks" Daily Activity     Outcome Measure Help from another person eating meals?: None Help from another person taking care of personal grooming?: None Help from another person toileting, which includes using toliet, bedpan, or urinal?: A Lot Help from another person bathing (including washing, rinsing, drying)?: A Lot Help from another person to put on and taking off regular upper body clothing?: A Lot Help from another person to put on and taking off regular lower body clothing?: A Lot 6 Click Score: 16   End of Session Equipment Utilized During Treatment: Gait belt Nurse Communication: Mobility status  Activity Tolerance: Patient tolerated treatment well Patient  left: in chair;with call bell/phone within reach;with family/visitor present  OT Visit Diagnosis: Unsteadiness on feet (R26.81);Muscle weakness (generalized) (M62.81)                Time: [id number] OT Time Calculation (min): 69 min Charges:  OT General Charges $OT Visit: 1 Visit OT Evaluation $OT Eval Moderate Complexity: 1 Mod OT Treatments $Self Care/Home Management : 38-52 mins  Gwenetta Devos, OTR/L Acute Care Rehab Services  Office [phone]   Lenward Chancellor 09/10/2019, 12:14 PM

## 2019-09-10 NOTE — Progress Notes (Signed)
   PATIENT ID: Adam Shannon   1 Day Post-Op Procedure(s) (LRB): REVERSE TOTAL SHOULDER ARTHROPLASTY (Left)  Subjective: Doing very well, no pain. Block still working. Hopes to go home today. Requests no pain meds be sent home.   Objective:  Vitals:   09/10/19 0124 09/10/19 0543  BP: 108/85 112/76  Pulse: 92 92  Resp: 18 18  Temp: (!) 97.4 F (36.3 C) (!) 97.3 F (36.3 C)  SpO2: 100% 100%     L UE dressing changed, aquacell put on Incision benign Wiggles fingers  Labs:  Recent Labs    09/10/19 0429  HGB 9.7*   Recent Labs    09/10/19 0429  WBC 6.4  RBC 2.74*  HCT 30.3*  PLT 95*   Recent Labs    09/10/19 0429  NA 137  K 3.6  CL 109  CO2 21*  BUN 26*  CREATININE 1.91*  GLUCOSE 176*  CALCIUM 10.3    Assessment and Plan: 1 day s/p L reverse TSA OT hand wrist elbow only D/c home today when cleared by OT Fu in 2 weeks Resume coumadin tonight, still theraputic  VTE proph: asa, scds

## 2019-09-10 NOTE — Progress Notes (Signed)
D/C instructions given to patient. Patient had no questions. NT or writer can wheel patient out once he is ready

## 2019-09-10 NOTE — Discharge Summary (Signed)
Patient ID: ABDALLA ADORNETTO MRN: [id number] DOB/AGE: 11/14/1940 79 y.o.  Admit date: 09/09/2019 Discharge date: 09/10/2019  Admission Diagnoses:  Active Problems:   Status post reverse total shoulder replacement, left   Discharge Diagnoses:  Same  Past Medical History:  Diagnosis Date  . 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 Atrial fibrillation ([place])   . Basal cell carcinoma   . CHF (congestive heart failure) ([place])   . Chronic kidney disease 10/05/2018   stage 3  . Complication of anesthesia    Hallucinations  . Coronary artery disease   . DDD (degenerative disc disease), lumbar   . Diverticulosis   . Dysrhythmia    a-fib - HX  . Hearing loss   . History of carpal tunnel syndrome   . History of hematuria   . History of kidney stones   . Hyperlipemia   . Hypertension   . Mitral regurgitation   . Myocardial infarction ([place]) 1998  . Neuropathy   . OA (osteoarthritis)   . OSA (obstructive sleep apnea)   . Osteoporosis   . Pacemaker   . Peripheral vascular disease ([place])   . Rosacea   . Seasonal allergies   . Squamous cell carcinoma of skin   . Venous stasis   . Vitamin D deficiency   . Wears glasses    reading only  . Wears hearing aid    both ears    Surgeries: Procedure(s): REVERSE TOTAL SHOULDER ARTHROPLASTY on 09/09/2019   Consultants:   Discharged Condition: Improved  Hospital Course: DOMNIQUE SCHETTLER is an 79 y.o. 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 Patient has severe unremitting pain that affects sleep, daily activities, and work/hobbies. After pre-op clearance the patient was taken to the operating room on 09/09/2019 and underwent  Procedure(s): REVERSE TOTAL SHOULDER ARTHROPLASTY.    Patient was given perioperative antibiotics:  Anti-infectives (From admission, onward)   Start     Dose/Rate Route Frequency Ordered Stop   09/09/19 1800  ceFAZolin (ANCEF) IVPB 1 g/50 mL premix     1 g 100 mL/hr over  30 Minutes Intravenous Every 6 hours 09/09/19 1559 09/10/19 0637   09/09/19 1045  ceFAZolin (ANCEF) IVPB 2g/100 mL premix     2 g 200 mL/hr over 30 Minutes Intravenous On call to O.R. 09/09/19 1030 09/09/19 1235       Patient was given sequential compression devices, early ambulation, and asa to prevent DVT.  Patient benefited maximally from hospital stay and there were no complications.    Recent vital signs:  Patient Vitals for the past 24 hrs:  BP Temp Temp src Pulse Resp SpO2 Height Weight  09/10/19 0543 112/76 (!) 97.3 F (36.3 C) Oral 92 18 100 % -- --  09/10/19 0124 108/85 (!) 97.4 F (36.3 C) Oral 92 18 100 % -- --  09/09/19 2204 116/80 (!) 97.4 F (36.3 C) Oral 98 17 100 % -- --  09/09/19 1855 118/84 (!) 97.4 F (36.3 C) -- (!) 106 -- 100 % -- --  09/09/19 1752 (!) 127/98 -- -- 79 -- 100 % -- --  09/09/19 1702 (!) 129/98 -- -- (!) 105 -- 100 % -- --  09/09/19 1600 116/84 (!) 97.4 F (36.3 C) Oral (!) 102 16 -- 5\' 9"  (1.753 m) 73.5 kg  09/09/19 1500 (!) 119/97 -- -- 92 18 100 % -- --  09/09/19 1445 127/82 (!) 97.5 F (36.4 C) -- 87 12 100 % -- --  09/09/19 1430 127/87 -- -- 89 13 100 % -- --  09/09/19 1415 (!) 133/96 -- -- 87 19 100 % -- --  09/09/19 1400 119/77 -- -- 94 11 100 % -- --  09/09/19 1200 130/83 -- -- 74 12 100 % -- --  09/09/19 1155 (!) 139/95 -- -- 81 (!) 22 100 % -- --  09/09/19 1114 -- -- -- -- -- -- -- 73.5 kg  09/09/19 1111 (!) 135/101 98.4 F (36.9 C) Oral 76 16 100 % -- --     Recent laboratory studies:  Recent Labs    09/09/19 1100 09/10/19 0429  WBC  --  6.4  HGB  --  9.7*  HCT  --  30.3*  PLT  --  95*  NA  --  137  K  --  3.6  CL  --  109  CO2  --  21*  BUN  --  26*  CREATININE  --  1.91*  GLUCOSE  --  176*  INR 1.6*  --   CALCIUM  --  10.3     Discharge Medications:   Allergies as of 09/10/2019      Reactions   Metoprolol Other (See Comments)   Severe bradycardia   Heparin Itching   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   Hydrocodone Itching   (VICODIN) insomnia   Oxycodone Itching, Other (See Comments)   PERCOCET--muscle twitching   Tramadol Other (See Comments)   Loopy, lose of balance, urinary retention      Medication List    TAKE these medications   aspirin EC 81 MG tablet Take 81 mg by mouth daily.   dofetilide 125 MCG capsule Commonly known as: TIKOSYN Take 125 mcg by mouth in the morning and at bedtime.   finasteride 5 MG tablet Commonly known as: PROSCAR Take 5 mg by mouth daily.   furosemide 40 MG tablet Commonly known as: LASIX Take 40 mg by mouth daily as needed (fluid retention/swelling.).   ipratropium 0.06 % nasal spray Commonly known as: ATROVENT Place 1 spray into both nostrils in the morning and at bedtime.   magnesium hydroxide 400 MG/5ML suspension Commonly known as: MILK OF MAGNESIA Take 30 mLs by mouth daily as needed for mild constipation.   minocycline 100 MG capsule Commonly known as: MINOCIN Take 100 mg by mouth every other day. In the morning   nitroGLYCERIN 0.4 MG SL tablet Commonly known as: NITROSTAT Place 0.4 mg under the tongue every 5 (five) minutes x 3 doses as needed for chest pain.   polyethylene glycol 17 g packet Commonly known as: MIRALAX / GLYCOLAX Take 17 g by mouth daily as needed (constipation.).   potassium chloride 10 MEQ tablet Commonly known as: KLOR-CON Take 10 mEq by mouth daily as needed (with furosemide (fluid retention/swelling)).   rosuvastatin 40 MG tablet Commonly known as: CRESTOR Take 40 mg by mouth at bedtime.   timolol 0.5 % ophthalmic solution Commonly known as: TIMOPTIC Place 1 drop into both eyes daily.   vitamin B-12 1000 MCG tablet Commonly known as: CYANOCOBALAMIN Take 1,000 mcg  by mouth daily.   VITAMIN D3 PO Take 1 tablet by mouth daily.   warfarin 3 MG tablet Commonly known as: COUMADIN Take 3-4.5 mg by mouth See admin instructions.  Take 1.5 tablets (4.5 mg) by mouth on Mondays in the evening & take 1 tablet (3 mg) by mouth on all other days in the evening.       Diagnostic Studies: DG Chest 2 View  Result Date: 09/06/2019 CLINICAL DATA:  Preoperative shoulder surgery.  Cardiac arrhythmia EXAM: CHEST - 2 VIEW COMPARISON:  December 31, 2010 FINDINGS: There is a pacemaker present with lead tips attached to the right atrium and coronary sinus. No pneumothorax. There is a small granuloma in the left upper lobe. Lungs elsewhere clear. Heart is mildly enlarged with pulmonary vascularity normal. No adenopathy. There is aortic atherosclerosis. No bone lesions. IMPRESSION: Pacemaker present with lead tips attached to right atrium and coronary sinus. No pneumothorax. Small granuloma left upper lobe. Lungs otherwise clear. Heart mildly enlarged. No adenopathy. Aortic Atherosclerosis (ICD10-I70.0). Electronically Signed   By: Lowella Grip III M.D.   On: 09/06/2019 15:26   DG Shoulder Left Port  Result Date: 09/09/2019 CLINICAL DATA:  Left shoulder replacement. EXAM: LEFT SHOULDER COMPARISON:  12/31/2010. FINDINGS: Total left shoulder replacement. Hardware intact. Anatomic alignment. No acute bony abnormality. AICD noted. Prior median sternotomy. IMPRESSION: Total left shoulder replacement with anatomic alignment. Electronically Signed   By: Marcello Moores  Register   On: 09/09/2019 14:41    Disposition: Discharge disposition: 01-Home or Self Care       Discharge Instructions    Call MD / Call 911   Complete by: As directed    If you experience chest pain or shortness of breath, CALL 911 and be transported to the hospital emergency room.  If you develope a fever above 101 F, pus (white drainage) or increased drainage or redness at the wound, or calf pain, call your  surgeon's office.   Constipation Prevention   Complete by: As directed    Drink plenty of fluids.  Prune juice may be helpful.  You may use a stool softener, such as Colace (over the counter) 100 mg twice a day.  Use MiraLax (over the counter) for constipation as needed.   Diet - low sodium heart healthy   Complete by: As directed    Increase activity slowly as tolerated   Complete by: As directed       Follow-up Information    Tania Ade, MD. Schedule an appointment as soon as possible for a visit in 2 weeks.   Specialty: Orthopedic Surgery Contact information: [place] [address] [phone]            Signed: Grier Mitts 09/10/2019, 8:37 AM

## 2022-02-20 DEATH — deceased

## 2022-05-15 IMAGING — DX DG CHEST 2V
2 series · 2 of 2 positions shown · non-contrast
Comparison: December 31, 2010

CLINICAL DATA: Preoperative shoulder surgery.  Cardiac arrhythmia

EXAM:
CHEST - 2 VIEW

[chest pa]
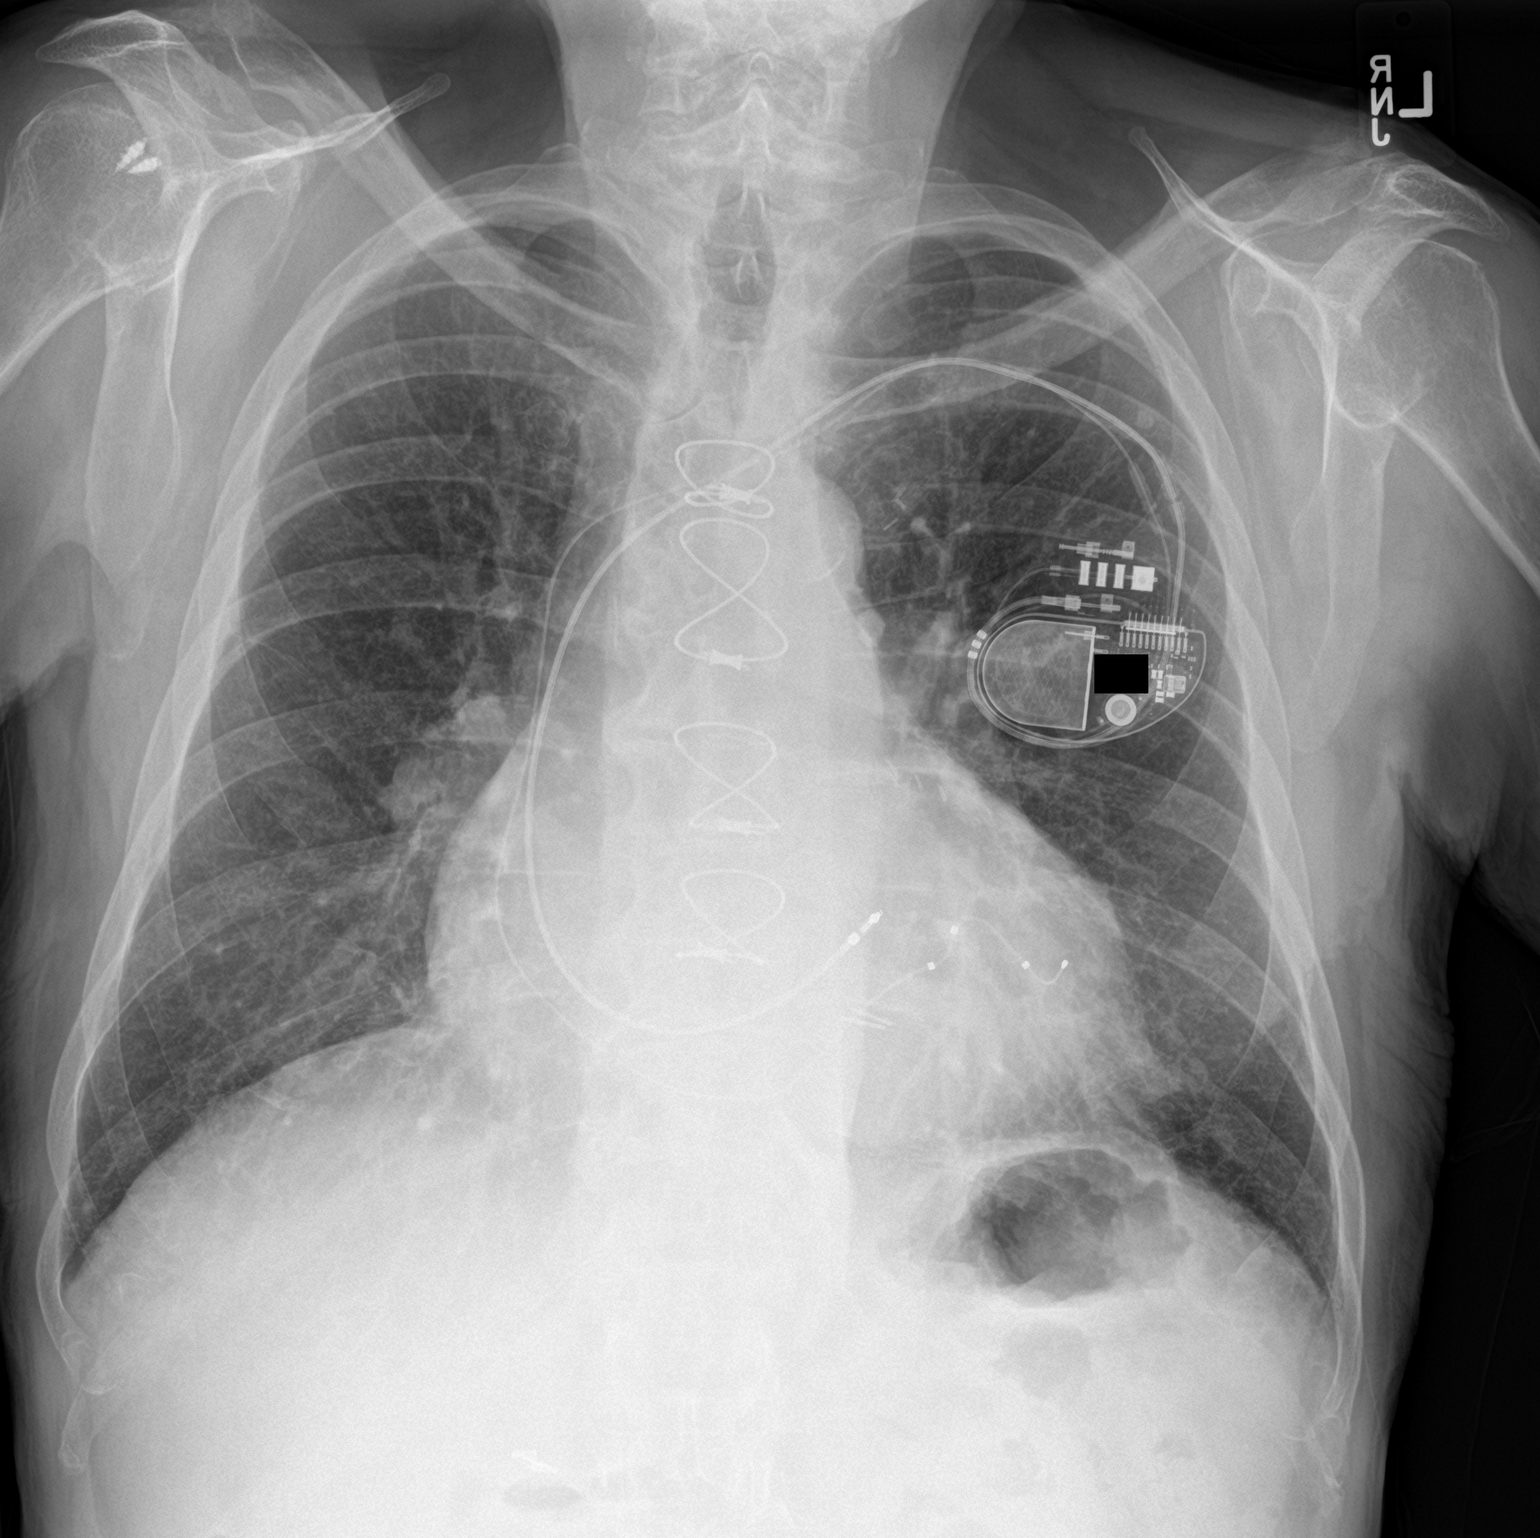

[chest lat]
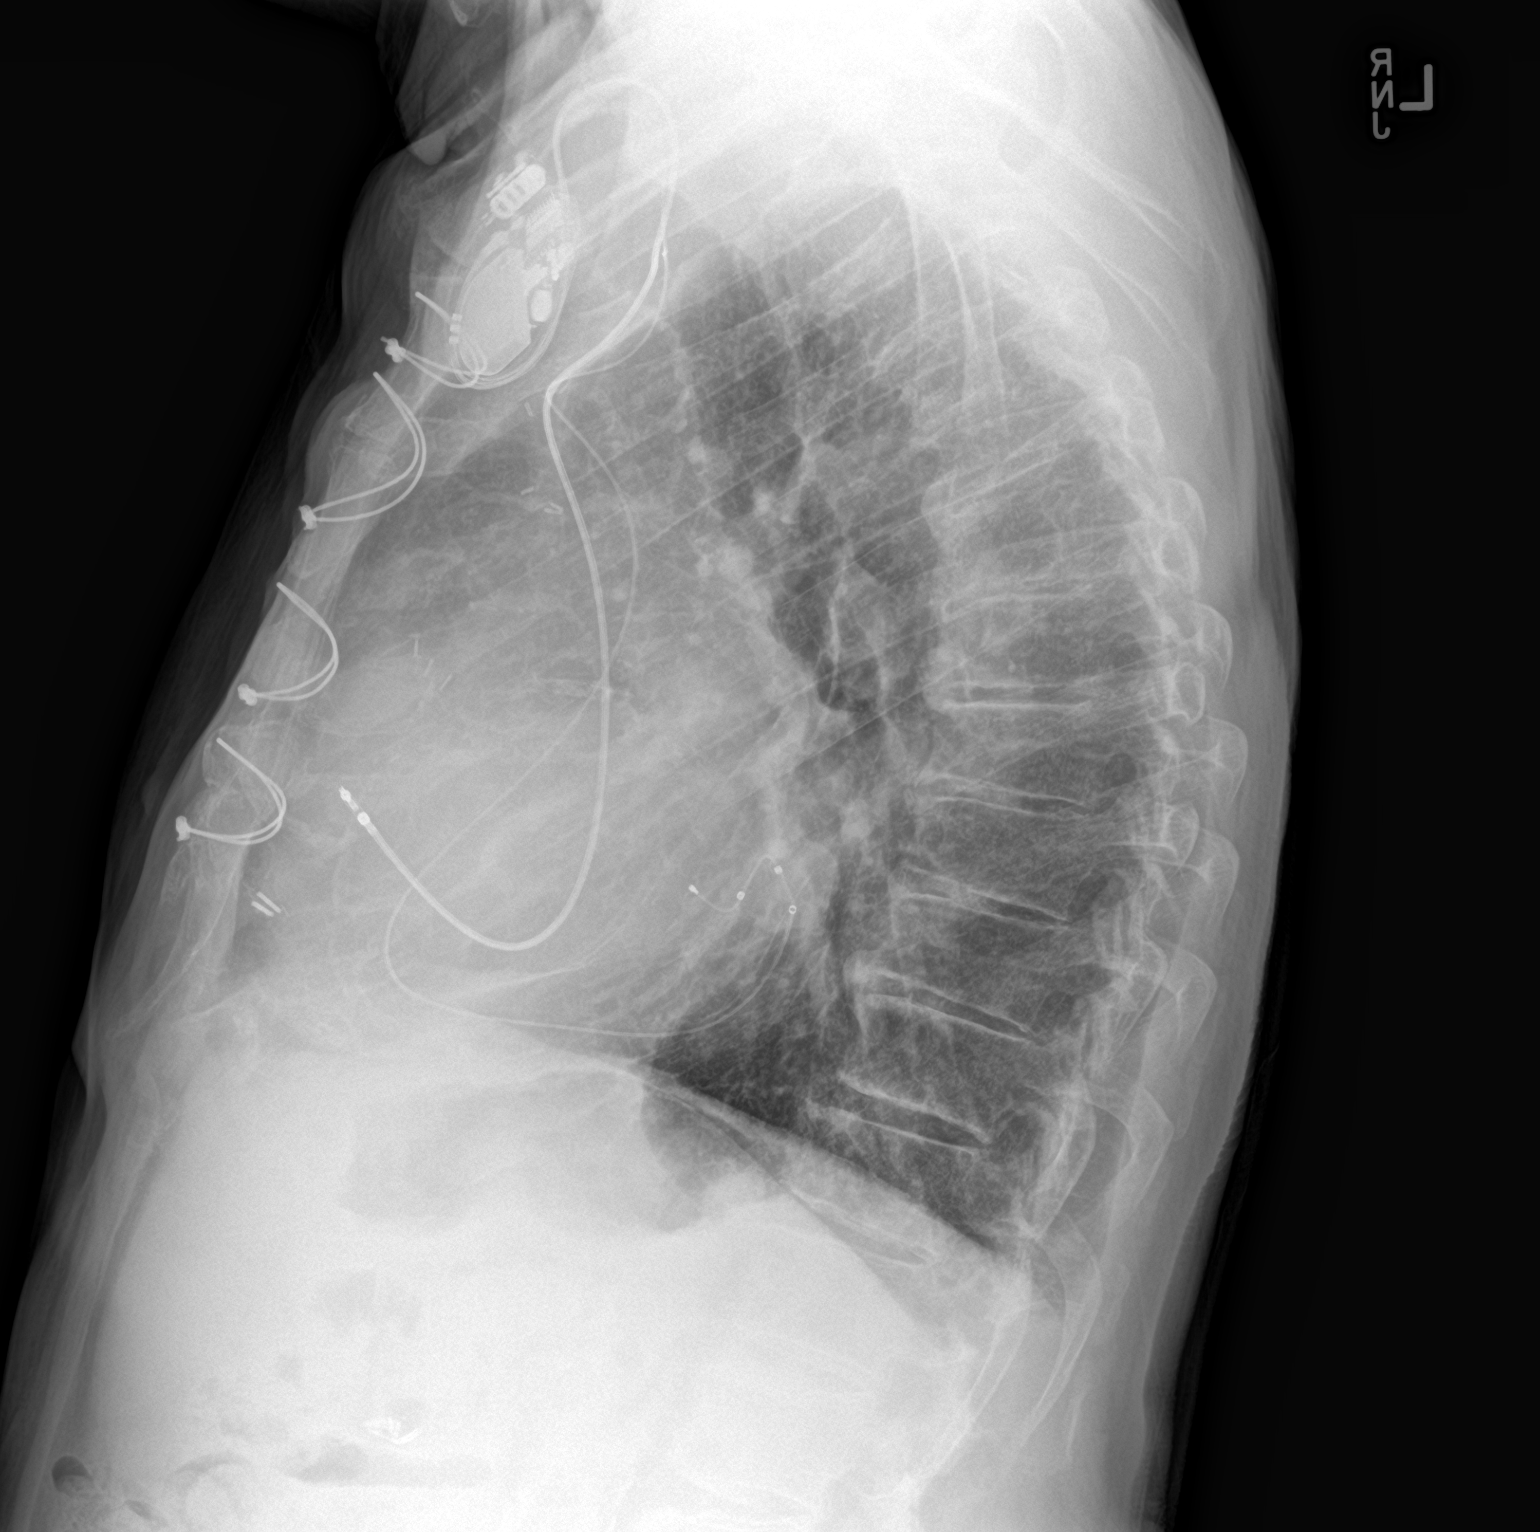

[2 of 2 positions shown; findings below may reference images not displayed]

FINDINGS: There is a pacemaker present with lead tips attached to the right
atrium and coronary sinus. No pneumothorax. There is a small
granuloma in the left upper lobe. Lungs elsewhere clear. Heart is
mildly enlarged with pulmonary vascularity normal. No adenopathy.
There is aortic atherosclerosis. No bone lesions.
IMPRESSION: Pacemaker present with lead tips attached to right atrium and
coronary sinus. No pneumothorax. Small granuloma left upper lobe.
Lungs otherwise clear. Heart mildly enlarged. No adenopathy. Aortic
Atherosclerosis ([id number]).

## 2022-05-18 IMAGING — DX DG SHOULDER 1V*L*
2 series · 2 of 2 positions shown · non-contrast
Comparison: 12/31/2010.

CLINICAL DATA: Left shoulder replacement.

EXAM:
LEFT SHOULDER

[shoulder ap (1 of 2)]
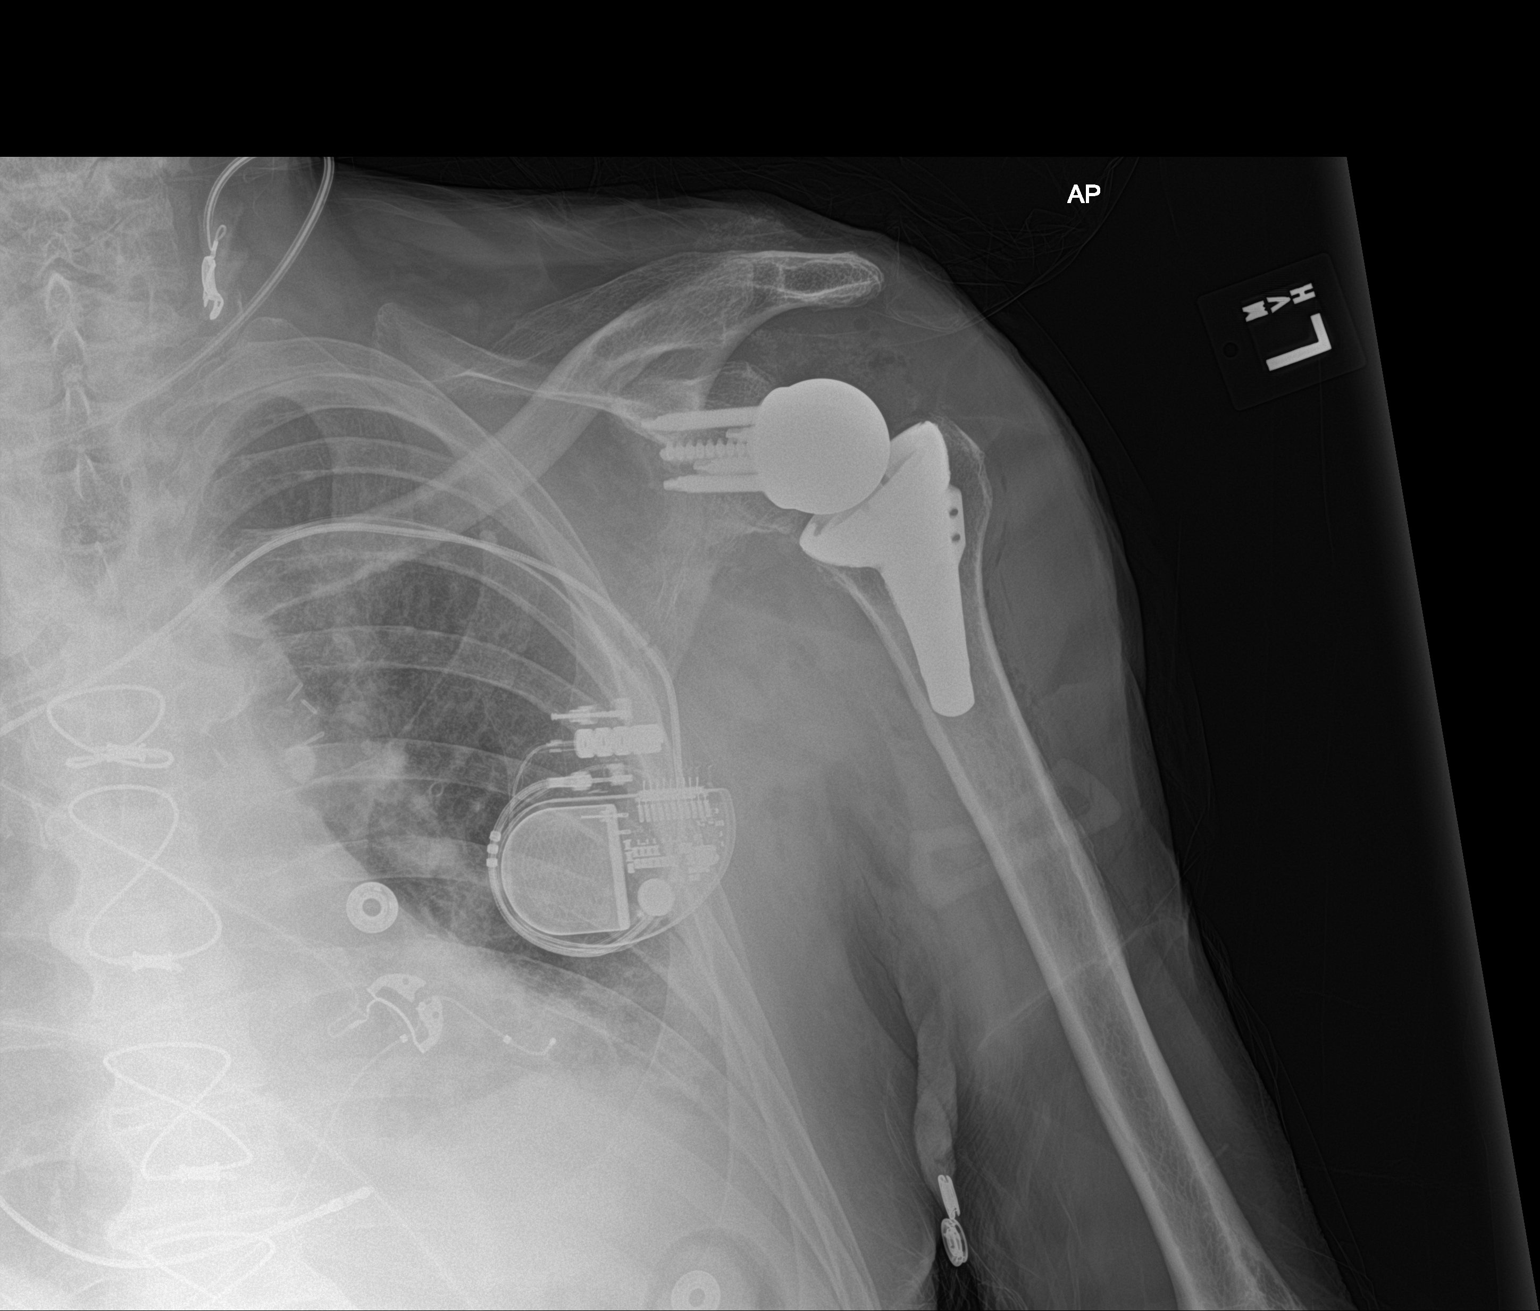

[shoulder ap (2 of 2)]
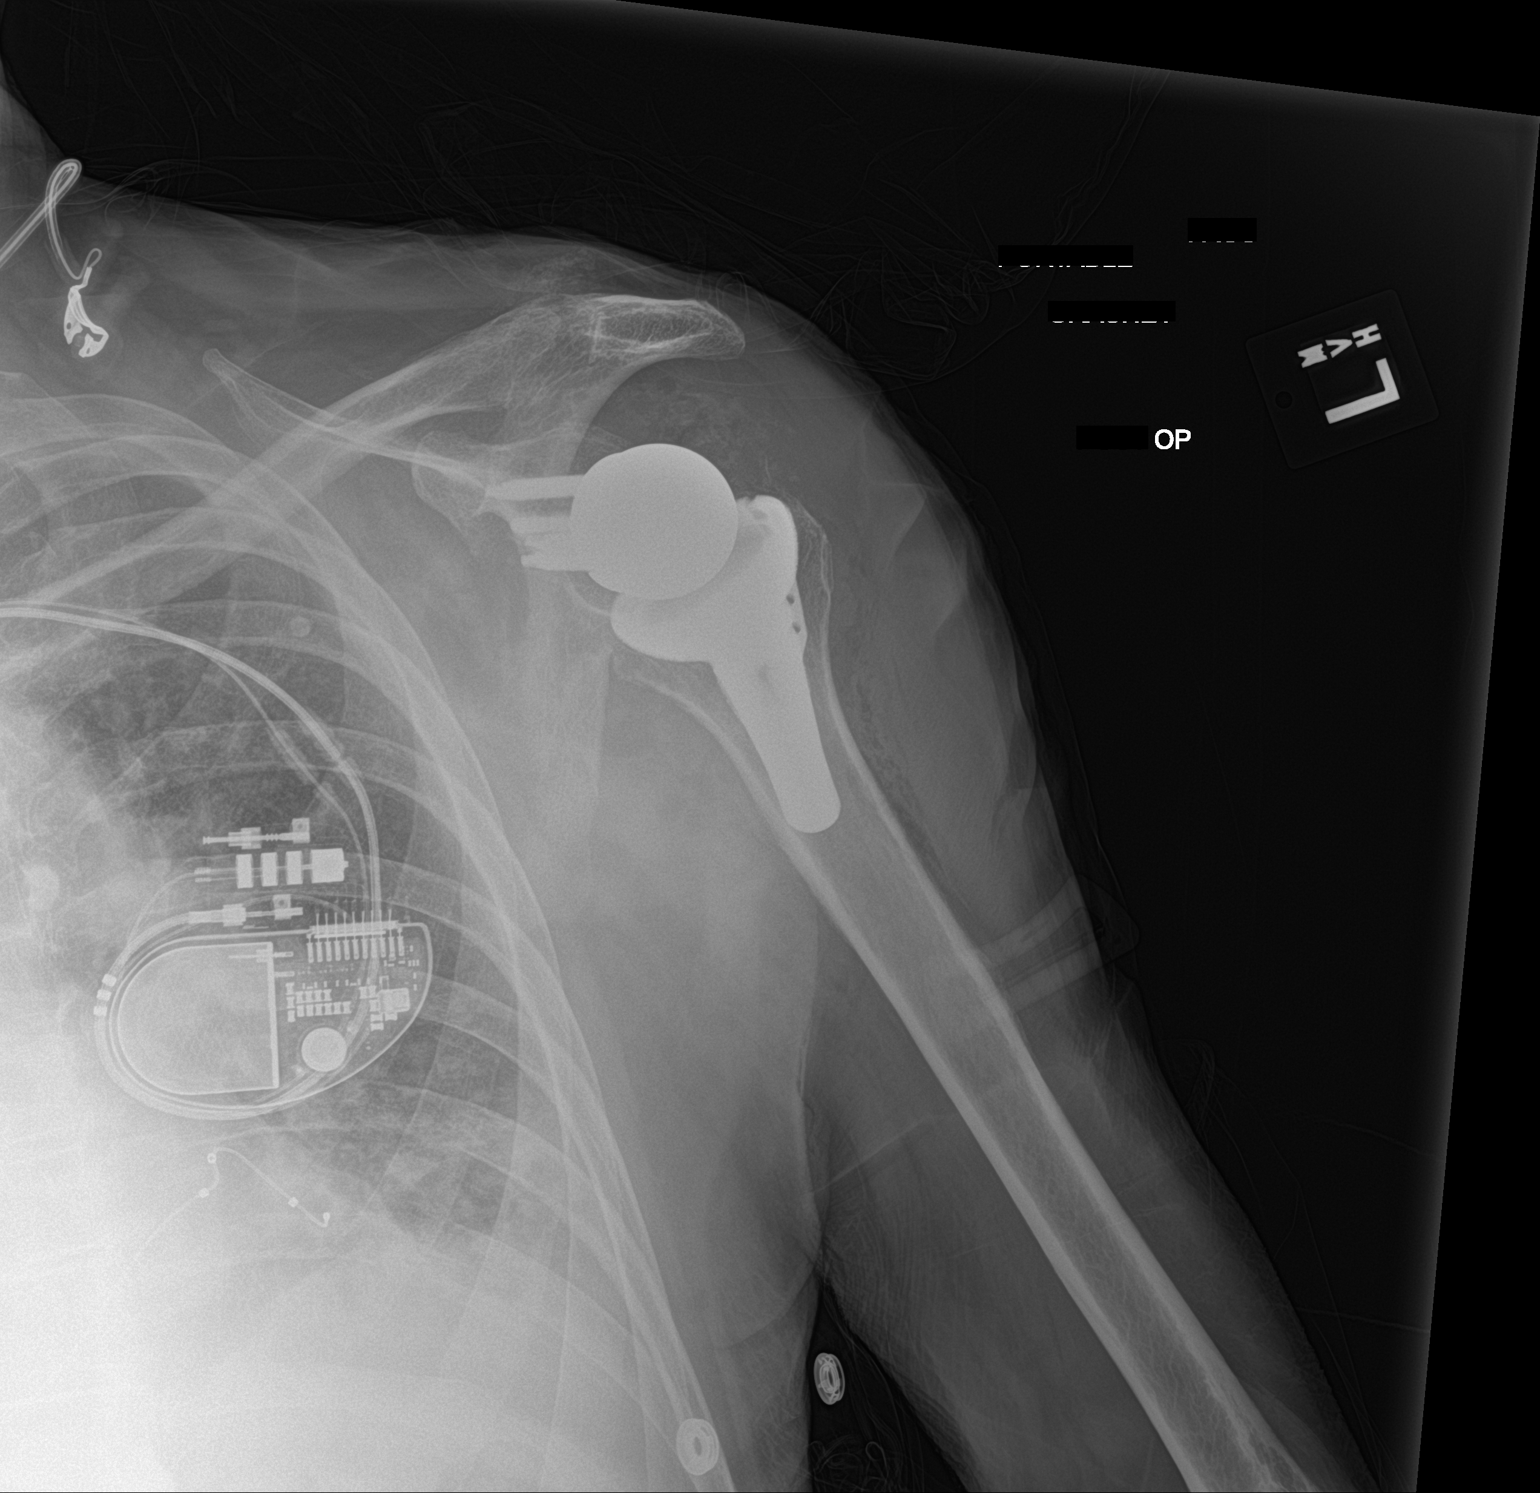

[2 of 2 positions shown; findings below may reference images not displayed]

FINDINGS: Total left shoulder replacement. Hardware intact. Anatomic
alignment. No acute bony abnormality. AICD noted. Prior median
sternotomy.
IMPRESSION: Total left shoulder replacement with anatomic alignment.
